# Patient Record
Sex: Male | Born: 1940 | Race: White | Hispanic: No | Marital: Married
Health system: Midwestern US, Community
[De-identification: ages and names within clinical notes are randomized; demographics above are authoritative.]

## PROBLEM LIST (undated history)

## (undated) DIAGNOSIS — I639 Cerebral infarction, unspecified: Secondary | ICD-10-CM

## (undated) DIAGNOSIS — E119 Type 2 diabetes mellitus without complications: Secondary | ICD-10-CM

## (undated) DIAGNOSIS — H269 Unspecified cataract: Secondary | ICD-10-CM

## (undated) DIAGNOSIS — I10 Essential (primary) hypertension: Secondary | ICD-10-CM

## (undated) DIAGNOSIS — H409 Unspecified glaucoma: Secondary | ICD-10-CM

## (undated) HISTORY — PX: IRIDOTOMY / IRIDECTOMY: SHX165

## (undated) HISTORY — DX: Cerebral infarction, unspecified: I63.9

## (undated) HISTORY — PX: TRABECULECTOMY: SHX107

## (undated) HISTORY — DX: Unspecified cataract: H26.9

## (undated) HISTORY — DX: Type 2 diabetes mellitus without complications: E11.9

## (undated) HISTORY — DX: Essential (primary) hypertension: I10

## (undated) HISTORY — PX: EYE SURGERY: SHX253

## (undated) HISTORY — DX: Unspecified glaucoma: H40.9

## (undated) HISTORY — PX: YAG LASER APPLICATION: SHX6189

## (undated) HISTORY — PX: CATARACT EXTRACTION: SUR2

---

## 2016-05-09 ENCOUNTER — Inpatient Hospital Stay: Admit: 2016-05-09 | Discharge: 2016-05-09 | Disposition: A | Discharge status: Home / Self Care | Payer: MEDICARE

## 2016-05-09 ENCOUNTER — Emergency Department: Admit: 2016-05-09 | Payer: MEDICARE | Primary: Family Medicine

## 2016-05-09 DIAGNOSIS — S96812A Strain of other specified muscles and tendons at ankle and foot level, left foot, initial encounter: Secondary | ICD-10-CM

## 2016-05-09 NOTE — ED Triage Notes (Signed)
Right heel pain since 10/25, pt believes it was from exercising using the rowing machine. Increased pain in heel with ankle flexion. Ambulated back without difficulty. No deformities, swelling, bruising noted. Strong pedal pulse, cap refill < 3 seconds

## 2016-05-09 NOTE — ED Provider Notes (Addendum)
Patient is a 75 y.o. male presenting with foot pain. The history is provided by the patient.   Foot Pain    This is a new problem. Episode onset: 1 week ago. The problem occurs constantly. The problem has been gradually improving. Pain location: left heel. The quality of the pain is described as dull. The pain is at a severity of 2/10. The pain is mild. Pertinent negatives include no numbness, full range of motion, no stiffness, no tingling, no itching, no back pain and no neck pain. The symptoms are aggravated by contact and activity. He has tried nothing for the symptoms. The treatment provided no relief. There has been no history of extremity trauma.    One week ago, after finishing about 30 minutes on a rowing machine, he noted pain in the plantar aspect of his left heel that is mild, dull, less painful in the morning, and worse as he walks. He has had no direct trauma. He hasn't seen any redness or swelling. He has not pain over his Achilles or ankle. He has not tried anything for his pain. His friends convinced him that he should get an xray.    Past Medical History:   Diagnosis Date   ??? Diabetes (HCC)    ??? Glaucoma    ??? High cholesterol    ??? Hypertension    ??? Macular degeneration        History reviewed. No pertinent surgical history.      History reviewed. No pertinent family history.    Social History     Social History   ??? Marital status: N/A     Spouse name: N/A   ??? Number of children: N/A   ??? Years of education: N/A     Occupational History   ??? Not on file.     Social History Main Topics   ??? Smoking status: Never Smoker   ??? Smokeless tobacco: Never Used   ??? Alcohol use Yes      Comment: socially   ??? Drug use: No   ??? Sexual activity: Not on file     Other Topics Concern   ??? Not on file     Social History Narrative   ??? No narrative on file         ALLERGIES: Review of patient's allergies indicates no known allergies.    Review of Systems   Constitutional: Negative for chills and fever.    HENT: Negative for sore throat.    Eyes: Negative for pain.   Respiratory: Negative for shortness of breath.    Cardiovascular: Negative for chest pain, palpitations and leg swelling.   Gastrointestinal: Negative for abdominal pain and vomiting.   Endocrine: Negative for polydipsia.   Genitourinary: Negative for hematuria.   Musculoskeletal: Negative for back pain, joint swelling, neck pain, neck stiffness and stiffness.   Skin: Negative for itching.   Allergic/Immunologic: Negative for immunocompromised state.   Neurological: Negative for tingling, weakness, light-headedness and numbness.   Hematological: Negative for adenopathy. Does not bruise/bleed easily.   Psychiatric/Behavioral: Negative for confusion.   All other systems reviewed and are negative.      Vitals:    05/09/16 1313   BP: 173/74   Pulse: (!) 56   Resp: 18   Temp: 98.3 ??F (36.8 ??C)   Weight: 104.3 kg (230 lb)   Height: 5\' 11"  (1.803 m)            Physical Exam   Constitutional: He is oriented  to person, place, and time. He appears well-developed and well-nourished. No distress.   HENT:   Head: Normocephalic and atraumatic.   Right Ear: External ear normal.   Left Ear: External ear normal.   Eyes: EOM are normal. Pupils are equal, round, and reactive to light.   Neck: Normal range of motion. Neck supple.   Cardiovascular: Normal rate.    Pulmonary/Chest: Effort normal.   Musculoskeletal:        Left ankle: He exhibits normal range of motion, no swelling, no ecchymosis, no deformity, no laceration and normal pulse. No tenderness. No lateral malleolus, no medial malleolus, no AITFL, no CF ligament, no posterior TFL, no head of 5th metatarsal and no proximal fibula tenderness found. Achilles tendon exhibits no pain, no defect and normal Thompson's test results.        Left foot: There is tenderness and bony tenderness. There is normal range of motion, no swelling, normal capillary refill, no crepitus, no deformity and no laceration.        Feet:     Neurological: He is alert and oriented to person, place, and time. He has normal reflexes. No cranial nerve deficit. Coordination normal.   Skin: Skin is warm and dry. No erythema.   Psychiatric: He has a normal mood and affect.   Nursing note and vitals reviewed.       MDM  Number of Diagnoses or Management Options  Diagnosis management comments: Left heel pain x 1 wk, improving. Possible plantar fasciitis (but feels best first thing in the morning) vs occult fracture vs sprain       Amount and/or Complexity of Data Reviewed  Tests in the radiology section of CPT??: ordered and reviewed  Review and summarize past medical records: yes  Independent visualization of images, tracings, or specimens: yes    Risk of Complications, Morbidity, and/or Mortality  Presenting problems: moderate  Diagnostic procedures: moderate  Management options: moderate      ED Course   Calcaneus xray - neg by my view (small plantar spur)    A): Strain, left heel    P): OTC NSAIDs for pain   Orthopedics f/u in one week if not improving    Procedures

## 2016-05-09 NOTE — ED Notes (Signed)
Portable x-ray in room 

## 2016-05-09 NOTE — ED Notes (Signed)
Discharge instructions reviewed with patient, verbalized understanding. All questions answered. Ambulated out without difficulty.

## 2016-09-30 DIAGNOSIS — N401 Enlarged prostate with lower urinary tract symptoms: Secondary | ICD-10-CM | POA: Insufficient documentation

## 2016-09-30 DIAGNOSIS — I10 Essential (primary) hypertension: Secondary | ICD-10-CM | POA: Insufficient documentation

## 2016-09-30 DIAGNOSIS — E7800 Pure hypercholesterolemia, unspecified: Secondary | ICD-10-CM | POA: Insufficient documentation

## 2017-02-16 NOTE — Progress Notes (Signed)
[facility] Note  02/17/2017     CHIEF COMPLAINT Patient presents for Diabetic Eye Exam   HISTORY OF PRESENT ILLNESS: Jeffery Beltran is a 76 y.o. male who presents to the clinic today for:   HPI    Diabetic Eye Exam  Vision is stable.  Associated Symptoms Trauma.  Negative for Flashes, Floaters, Distortion, Redness, Pain, Glare, Blind Spot, Photophobia, Scalp Tenderness, Fever, Weight Loss, Fatigue, Shoulder/Hip pain and Jaw Claudication (Pt states that he was hit in OD with baseball at age 89).  Diabetes characteristics include Type 2 and taking oral medications.  This started 25 years ago.  Blood sugar level is controlled.  Last Blood Glucose 112.  Last A1C 7.  I, the attending physician,  performed the HPI with the patient and updated documentation appropriately.        Comments  Pt reports that he has has trab OD, cat sx OD, and YAG Cap OD; Pt reports using alphagan OU BID, azopt OS BID, and xalatan OU qhs;      Last edited by Rennis ChrisZamora, Kristelle Cavallaro, MD on 02/17/2017 10:38 AM. (History)      Referring physician: A. Melissa Solum [address] [place], [place] [id number]    HISTORICAL INFORMATION:   Selected notes from the MEDICAL RECORD NUMBER Referral from Dr. 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 AZOPT 1 % ophthalmic suspension   . brimonidine (ALPHAGAN P) 0.1 % SOLN Apply to eye.  . latanoprost (XALATAN) 0.005 % ophthalmic solution    No current facility-administered medications for this  visit.  (Ophthalmic Drugs)   Current Outpatient Prescriptions (Other)  Medication Sig  . metFORMIN (GLUCOPHAGE-XR) 500 MG 24 hr tablet Take one tablet at supper for first week, then increase to two tablets at supper.  Marland Kitchen. HUMALOG KWIKPEN 100 UNIT/ML KiwkPen   . hydrochlorothiazide (HYDRODIURIL) 12.5 MG tablet Take by mouth.  . hydrochlorothiazide (MICROZIDE) 12.5 MG capsule   . Insulin Glargine 300 UNIT/ML SOPN Inject into the skin.  . Loratadine 10 MG CAPS Take by mouth.  . losartan (COZAAR) 100 MG tablet Take by mouth.  . nebivolol (BYSTOLIC) 5 MG tablet Take by mouth.  Marland Kitchen. NOVOFINE 32G X 6 MM MISC   . ONE TOUCH ULTRA TEST test strip   . simvastatin (ZOCOR) 20 MG tablet    No current facility-administered medications for this visit.  (Other)      REVIEW OF SYSTEMS: ROS    Negative for: Constitutional, Gastrointestinal, Neurological, Skin, Genitourinary, Musculoskeletal, HENT, Endocrine, Cardiovascular, Eyes, Respiratory, Psychiatric, Allergic/Imm, Heme/Lymph   Last edited by Murtis Sinkobinson, Meredith on 02/17/2017  9:34 AM. (History)       ALLERGIES No Known Allergies  PAST MEDICAL HISTORY Past Medical History:  Diagnosis Date  . Diabetes (HCC)   . Glaucoma   . Hypertension    Past Surgical History:  Procedure Laterality Date  . CATARACT EXTRACTION Right   . TRABECULECTOMY Right   . YAG LASER APPLICATION  Right     FAMILY HISTORY Family History  Problem Relation Age of Onset  . Amblyopia Neg Hx   . Blindness Neg Hx   . Cataracts Neg Hx   . Glaucoma Neg Hx   . Macular degeneration Neg Hx   . Retinal detachment Neg Hx   . Strabismus Neg Hx   . Retinitis pigmentosa Neg Hx     SOCIAL HISTORY Social History  Substance Use Topics  . Smoking status: Never Smoker  . Smokeless tobacco: Never Used  . Alcohol use Not on file         OPHTHALMIC EXAM:  Base Eye Exam    Visual Acuity (Snellen - Linear)      Right Left   Dist cc 20/200 20/25   Dist ph cc NI 20/20    Correction:  Glasses       Tonometry (Applanation, 9:31 AM)      Right Left   Pressure 18 16       Pupils      Dark Light Shape React APD   Right 4 3 Round Brisk None   Left 4 3 Round Brisk None       Visual Fields (Counting fingers)      Left Right    Full Full       Extraocular Movement      Right Left    Full, Ortho Full, Ortho       Dilation    Both eyes:  1.0% Mydriacyl, 2.5% Phenylephrine @ 9:31 AM   1% myd and 2.5% phen X 2 @ 9:55 AM        Slit Lamp and Fundus Exam    External Exam      Right Left   External Normal Normal       Slit Lamp Exam      Right Left   Lids/Lashes Dermatochalasis - upper lid Dermatochalasis - upper lid   Conjunctiva/Sclera White and quiet White and quiet   Cornea tempral incision well healed Clear; mild vertical endostria temporal   Anterior Chamber Deep and quiet Deep and quiet   Iris Round and reactive; dilated moderate dilation 4.5 mm    Lens Posterior chamber intraocular lens; open PC 1-2+ Nuclear sclerosis   Vitreous Vitreous syneresis - mild Normal       Fundus Exam      Right Left   Disc Normal Full ?drusen superior   C/D Ratio 0.2 0.1   Macula Atrophy central with RPE clumping , no edema; single MA ST to fovea Flat, normal   Vessels attenuated Normal, mild tortuosity   Periphery Flat Flat        Refraction    Wearing Rx      Sphere Cylinder Axis Add   Right -0.75 +0.75 010 +3.00   Left -0.25 +1.00 169 +3.00   Age:  3yrs   Type:  Bifocal       Manifest Refraction      Sphere Cylinder Axis Dist VA   Right -0.75 +0.75 010 20/200 (NI)   Left -0.75 +1.00 170 20/20          IMAGING AND PROCEDURES  Imaging and Procedures for 02/17/17  OCT, Retina - OU - Both Eyes     Right Eye Quality was good. Central Foveal Thickness: 321. Progression has no prior data. Findings include abnormal foveal contour, epiretinal membrane (Central outer retinal atrophy; no IRF/SRF).   Left Eye Quality was good. Central  Foveal Thickness: 333.  Progression has no prior data. Findings include normal foveal contour (No IRF/SRF).   Notes Images taken, stored on drive                ASSESSMENT/PLAN:    ICD-10-CM   1. 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 History of trabeculectomy Z98.890   4. 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 Type 2 diabetes mellitus without complication, unspecified whether long term insulin use (HCC) E11.9   7. Epiretinal membrane (ERM) of right eye H35.371   8. 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  History of CRVO w/ CME OD  - baseline VA 20/200 -- stable  - history of intravitreal steroids and steroid response - see below  - OCT today shows central foveal outer retinal atrophy but no IRF/SRF/edema  - monitor  2.  Pseudophakia OD  - s/p yag capsulotomy  - PCIOL in good position and clear  3,4.  Steroid response / Ocular Hypertension s/p trabeculectomy OD  - IOP okay today  - monitor   5.  NSC/CC OS  - The symptoms of cataract, surgical options, and treatments and risks were discussed with patient.  - discussed diagnosis and progression  - not yet visually significant  - monitor for now  6.  DM2 without retinopathy  - Discussed importance of tight BG and BP control  - will send letter to PCP and Endocrinologist  7. ERM OD  - mild ERM in right eye that is limited by central atrophy 2/2 h/o CRVO  - monitor -- no indication for surgery at this time  - will discuss if symptoms develop or vision worsens OD  8. Optic disc drusen OS  - discussed diagnosis and significance to vision  - not visually significant at this point  - will monitor for now -- consider b-scan ultrasound and/or OCT optic disc at next visit    Ophthalmic Meds Ordered this visit:  No orders of the defined types were  placed in this encounter.      Return in about 6 months (around 08/20/2017) for Dilated Exam, OCT; possible OCT disc and/or U/S.  There are no Patient Instructions on file for this visit.   Explained the diagnoses, plan, and follow up with the patient and they expressed understanding.  Patient expressed understanding of the importance of proper follow up care.   Karie Chimera, M.D., Ph.D. Vitreoretinal Surgeon [facility] 02/17/17     Abbreviations: M myopia (nearsighted); A astigmatism; H hyperopia (farsighted); P presbyopia; Mrx spectacle prescription;  CTL contact lenses; OD right eye; OS left eye; OU both eyes  XT exotropia; ET esotropia; PEK punctate epithelial keratitis; PEE punctate epithelial erosions; DES dry eye syndrome; MGD meibomian gland dysfunction; ATs artificial tears; PFAT's preservative free artificial tears; NSC nuclear sclerotic cataract; PSC posterior subcapsular cataract; ERM epi-retinal membrane; PVD posterior vitreous detachment; RD retinal detachment; DM diabetes mellitus; DR diabetic retinopathy; NPDR non-proliferative diabetic retinopathy; PDR proliferative diabetic retinopathy; CSME clinically significant macular edema; DME diabetic macular edema; dbh dot blot hemorrhages; CWS cotton wool spot; POAG primary open angle glaucoma; C/D cup-to-disc ratio; HVF humphrey visual field; GVF goldmann visual field; OCT optical coherence tomography; IOP intraocular pressure; BRVO Branch retinal vein occlusion; CRVO central retinal vein occlusion; CRAO central retinal artery  occlusion; BRAO branch retinal artery occlusion; RT retinal tear; SB scleral buckle; PPV pars plana vitrectomy; VH Vitreous hemorrhage; PRP panretinal laser photocoagulation; IVK intravitreal kenalog; VMT vitreomacular traction; MH Macular hole;  NVD neovascularization of the disc; NVE neovascularization elsewhere; AREDS age related eye disease study; ARMD age related macular  degeneration; POAG primary open angle glaucoma; EBMD epithelial/anterior basement membrane dystrophy; ACIOL anterior chamber intraocular lens; IOL intraocular lens; PCIOL posterior chamber intraocular lens; Phaco/IOL phacoemulsification with intraocular lens placement; PRK photorefractive keratectomy; LASIK laser assisted in situ keratomileusis; HTN hypertension; DM diabetes mellitus; COPD chronic obstructive pulmonary disease

## 2017-02-17 ENCOUNTER — Ambulatory Visit (INDEPENDENT_AMBULATORY_CARE_PROVIDER_SITE_OTHER): Payer: Medicare Other | Admitting: Ophthalmology

## 2017-02-17 ENCOUNTER — Encounter (INDEPENDENT_AMBULATORY_CARE_PROVIDER_SITE_OTHER): Payer: Self-pay | Admitting: Ophthalmology

## 2017-02-17 DIAGNOSIS — E119 Type 2 diabetes mellitus without complications: Secondary | ICD-10-CM | POA: Diagnosis not present

## 2017-02-17 DIAGNOSIS — H34811 Central retinal vein occlusion, right eye, with macular edema: Secondary | ICD-10-CM

## 2017-02-17 DIAGNOSIS — H2512 Age-related nuclear cataract, left eye: Secondary | ICD-10-CM

## 2017-02-17 DIAGNOSIS — H40059 Ocular hypertension, unspecified eye: Secondary | ICD-10-CM | POA: Diagnosis not present

## 2017-02-17 DIAGNOSIS — Z9889 Other specified postprocedural states: Secondary | ICD-10-CM

## 2017-02-17 DIAGNOSIS — Z961 Presence of intraocular lens: Secondary | ICD-10-CM

## 2017-02-17 DIAGNOSIS — H35371 Puckering of macula, right eye: Secondary | ICD-10-CM

## 2017-02-17 DIAGNOSIS — H47322 Drusen of optic disc, left eye: Secondary | ICD-10-CM

## 2017-08-21 NOTE — Progress Notes (Signed)
[facility] Note  08/22/2017     CHIEF COMPLAINT Patient presents for Retina Follow Up   HISTORY OF PRESENT ILLNESS: Jeffery Beltran is a 77 y.o. male who presents to the clinic today for:   HPI    Retina Follow Up    Patient presents with  CRVO/BRVO.  In right eye.  This started 6 months ago.  Severity is mild.  Since onset it is stable.  I, the attending physician,  performed the HPI with the patient and updated documentation appropriately.          Comments    F/U Crvo w/ Atrophy OD. Patient states his vision is stable, no new onset. Pt reports Dr. Joelene Millin told him he no longer had Diabetic Retinopathy. A1c 6.3 (10/18)  Pt uses Alphagan, Xalatan ,Azopt as directed        Last edited by Rennis Chris, MD on 08/22/2017  8:30 AM. (History)    Pt states there has been no change since being seen last;   Referring physician: A. Melissa Solum [address] [place], [place] [address]    HISTORICAL INFORMATION:   Selected notes from the MEDICAL RECORD NUMBER Referral from Dr. 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 AZOPT 1 % ophthalmic suspension   . brimonidine (ALPHAGAN P) 0.1 % SOLN Apply to eye.  . latanoprost (XALATAN) 0.005 % ophthalmic solution    No current facility-administered medications for this visit.  (Ophthalmic Drugs)   Current Outpatient Medications (Other)  Medication Sig  . HUMALOG KWIKPEN 100 UNIT/ML KiwkPen   . hydrochlorothiazide (HYDRODIURIL) 12.5 MG tablet Take by  mouth.  . hydrochlorothiazide (MICROZIDE) 12.5 MG capsule   . Insulin Glargine 300 UNIT/ML SOPN Inject into the skin.  . Loratadine 10 MG CAPS Take by mouth.  . losartan (COZAAR) 100 MG tablet Take by mouth.  . metFORMIN (GLUCOPHAGE-XR) 500 MG 24 hr tablet Take one tablet at supper for first week, then increase to two tablets at supper.  . nebivolol (BYSTOLIC) 5 MG tablet Take by mouth.  Marland Kitchen NOVOFINE 32G X 6 MM MISC   . ONE TOUCH ULTRA TEST test strip   . simvastatin (ZOCOR) 20 MG tablet    No current facility-administered medications for this visit.  (Other)      REVIEW OF SYSTEMS: ROS    Positive for: Endocrine, Eyes   Negative for: Constitutional, Gastrointestinal, Neurological, Skin, Genitourinary, Musculoskeletal, HENT, Cardiovascular, Respiratory, Psychiatric, Allergic/Imm, Heme/Lymph   Last edited by Eldridge Scot, LPN on 12/10/5407  8:14 AM. (History)       ALLERGIES No Known Allergies  PAST MEDICAL HISTORY Past Medical History:  Diagnosis Date  . Diabetes (HCC)   . Glaucoma   . Hypertension    Past Surgical History:  Procedure Laterality Date  . CATARACT EXTRACTION Right   . TRABECULECTOMY Right   . YAG LASER APPLICATION Right     FAMILY HISTORY Family History  Problem Relation Age of Onset  .  Amblyopia Neg Hx   . Blindness Neg Hx   . Cataracts Neg Hx   . Glaucoma Neg Hx   . Macular degeneration Neg Hx   . Retinal detachment Neg Hx   . Strabismus Neg Hx   . Retinitis pigmentosa Neg Hx     SOCIAL HISTORY Social History   Tobacco Use  . Smoking status: Never Smoker  . Smokeless tobacco: Never Used  Substance Use Topics  . Alcohol use: Not on file  . Drug use: Not on file         OPHTHALMIC EXAM:  Base Eye Exam    Visual Acuity (Snellen - Linear)      Right Left   Dist cc 20/250-1 20/25 +2   Dist ph cc NI 20/20   Correction:  Glasses       Tonometry (Tonopen, 8:18 AM)      Right Left   Pressure 25 17       Tonometry #2  (Tonopen, 8:18 AM)      Right Left   Pressure 22        Pupils      Dark Light Shape React APD   Right 3 2 Round Brisk None   Left 3 2 Round Brisk None       Visual Fields (Counting fingers)      Left Right    Full Full       Extraocular Movement      Right Left    Full, Ortho Full, Ortho       Neuro/Psych    Oriented x3:  Yes   Mood/Affect:  Normal       Dilation    Both eyes:  1.0% Mydriacyl, 2.5% Phenylephrine @ 8:18 AM        Slit Lamp and Fundus Exam    External Exam      Right Left   External Normal Normal       Slit Lamp Exam      Right Left   Lids/Lashes Dermatochalasis - upper lid Dermatochalasis - upper lid   Conjunctiva/Sclera White and quiet White and quiet   Cornea 1+ Punctate epithelial erosions, Well healed cataract wounds Clear; mild vertical endostria temporal   Anterior Chamber Deep and quiet Deep and quiet   Iris Round and dilated, No NVI Round and dilated   Lens Posterior chamber intraocular lens; open PC 1-2+ Nuclear sclerosis   Vitreous Vitreous syneresis - mild Posterior vitreous detachment, Weiss ring       Fundus Exam      Right Left   Disc Normal Full ?drusen superior   C/D Ratio 0.2 0.1   Macula RPE central atrophy and clumping, No heme or edema Flat, normal, mild Retinal pigment epithelial mottling   Vessels Vascular attenuation, Tortuous Normal, mild tortuosity   Periphery Flat Flat          IMAGING AND PROCEDURES  Imaging and Procedures for 08/22/17  OCT, Retina - OU - Both Eyes     Right Eye Quality was good. Central Foveal Thickness: 320. Progression has been stable. Findings include abnormal foveal contour, epiretinal membrane, subretinal hyper-reflective material, outer retinal atrophy, no IRF, no SRF.   Left Eye Quality was good. Central Foveal Thickness: 328. Progression has been stable. Findings include normal foveal contour, no IRF, no SRF.   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 CME - cystoid macular edema. PED - pigment epithelial detachment. IRF - intraretinal fluid. SRF - subretinal fluid. EZ - ellipsoid zone. ERM - epiretinal membrane. ORA - outer retinal atrophy. ORT - outer retinal tubulation. SRHM - subretinal hyper-reflective material                  ASSESSMENT/PLAN:    ICD-10-CM   1. 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 History of trabeculectomy Z98.890   4. 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 Type 2 diabetes mellitus without complication, unspecified whether long term insulin use (HCC) E11.9   7. Epiretinal membrane (ERM) of right eye H35.371   8. 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  History of CRVO w/ CME OD  - baseline VA 20/200 -- stable  - history of intravitreal steroids and steroid response - see below  - OCT stable today, still with central foveal outer retinal atrophy but no IRF/SRF/edema  - monitor  - F/U 6 months  2.  Pseudophakia OD  - s/p CE/IOL  - s/p yag capsulotomy  - beautiful surgery, doing well  - monitor  3,4.  Steroid response / Ocular Hypertension s/p trabeculectomy OD  - IOP borderline OD today  - using Alphagan OU BID, Xalatan OU qd, Azopt OS BID as perscribed by Dr. Joelene Millin  - recommend Azopt OD BID  - followed by Dr. Vena Austria at [facility] at [address], [address], [place] [address], telephone :[phone] fax:[phone]  - will send letter to Dr. Joelene Millin   - monitor   5.  NSC/CC OS  - The symptoms of cataract, surgical options, and treatments and risks were discussed with patient.  - discussed diagnosis and  progression  - not yet visually significant  - monitor for now  6.  DM2 without retinopathy  - Discussed importance of tight BG and BP control  - letters sent to PCP and Endocrinologist  7. ERM OD  - mild ERM in right eye that is limited by central atrophy 2/2 h/o CRVO  - monitor -- no indication for surgery at this time  - will discuss if symptoms develop or vision worsens OD  8. Optic disc drusen OS  - discussed diagnosis and significance to vision  - not visually significant at this point  - will monitor for now    Ophthalmic Meds Ordered this visit:  No orders of the defined types were placed in this encounter.      Return in about 6 months (around 02/19/2018) for Dilated Exam, OCT.  There are no Patient Instructions on file for this visit.   Explained the diagnoses, plan, and follow up with the patient and they expressed understanding.  Patient expressed understanding of the importance of proper follow up care.   This document serves as a record of services personally performed by Karie Chimera, MD, PhD. It was created on their behalf by Virgilio Belling, COA, a certified ophthalmic assistant. The creation of this record is the provider's dictation and/or activities during the visit.  Electronically signed by: Virgilio Belling, COA  08/22/17 9:33 AM    Karie Chimera, M.D., Ph.D. Vitreoretinal Surgeon [facility] 08/22/17  I have reviewed the above documentation for accuracy and completeness, and I agree with the above. Karie ChimeraBrian G. Yalonda Sample, M.D., Ph.D. 08/22/17 9:34 AM    Abbreviations: M myopia (nearsighted); A astigmatism; H hyperopia (farsighted); P presbyopia; Mrx spectacle prescription;  CTL contact lenses; OD right eye; OS left eye; OU both eyes  XT exotropia; ET esotropia; PEK punctate epithelial keratitis; PEE punctate epithelial erosions; DES dry eye syndrome; MGD meibomian gland dysfunction; ATs artificial tears; PFAT's preservative  free artificial tears; NSC nuclear sclerotic cataract; PSC posterior subcapsular cataract; ERM epi-retinal membrane; PVD posterior vitreous detachment; RD retinal detachment; DM diabetes mellitus; DR diabetic retinopathy; NPDR non-proliferative diabetic retinopathy; PDR proliferative diabetic retinopathy; CSME clinically significant macular edema; DME diabetic macular edema; dbh dot blot hemorrhages; CWS cotton wool spot; POAG primary open angle glaucoma; C/D cup-to-disc ratio; HVF humphrey visual field; GVF goldmann visual field; OCT optical coherence tomography; IOP intraocular pressure; BRVO Branch retinal vein occlusion; CRVO central retinal vein occlusion; CRAO central retinal artery occlusion; BRAO branch retinal artery occlusion; RT retinal tear; SB scleral buckle; PPV pars plana vitrectomy; VH Vitreous hemorrhage; PRP panretinal laser photocoagulation; IVK intravitreal kenalog; VMT vitreomacular traction; MH Macular hole;  NVD neovascularization of the disc; NVE neovascularization elsewhere; AREDS age related eye disease study; ARMD age related macular degeneration; POAG primary open angle glaucoma; EBMD epithelial/anterior basement membrane dystrophy; ACIOL anterior chamber intraocular lens; IOL intraocular lens; PCIOL posterior chamber intraocular lens; Phaco/IOL phacoemulsification with intraocular lens placement; PRK photorefractive keratectomy; LASIK laser assisted in situ keratomileusis; HTN hypertension; DM diabetes mellitus; COPD chronic obstructive pulmonary disease

## 2017-08-22 ENCOUNTER — Encounter (INDEPENDENT_AMBULATORY_CARE_PROVIDER_SITE_OTHER): Payer: Medicare Other | Admitting: Ophthalmology

## 2017-08-22 ENCOUNTER — Encounter (INDEPENDENT_AMBULATORY_CARE_PROVIDER_SITE_OTHER): Payer: Self-pay | Admitting: Ophthalmology

## 2017-08-22 ENCOUNTER — Ambulatory Visit (INDEPENDENT_AMBULATORY_CARE_PROVIDER_SITE_OTHER): Payer: Medicare Other | Admitting: Ophthalmology

## 2017-08-22 DIAGNOSIS — H47322 Drusen of optic disc, left eye: Secondary | ICD-10-CM | POA: Diagnosis not present

## 2017-08-22 DIAGNOSIS — H40059 Ocular hypertension, unspecified eye: Secondary | ICD-10-CM

## 2017-08-22 DIAGNOSIS — Z961 Presence of intraocular lens: Secondary | ICD-10-CM | POA: Diagnosis not present

## 2017-08-22 DIAGNOSIS — Z9889 Other specified postprocedural states: Secondary | ICD-10-CM

## 2017-08-22 DIAGNOSIS — H2512 Age-related nuclear cataract, left eye: Secondary | ICD-10-CM | POA: Diagnosis not present

## 2017-08-22 DIAGNOSIS — H35371 Puckering of macula, right eye: Secondary | ICD-10-CM | POA: Diagnosis not present

## 2017-08-22 DIAGNOSIS — H34811 Central retinal vein occlusion, right eye, with macular edema: Secondary | ICD-10-CM | POA: Diagnosis not present

## 2017-08-22 DIAGNOSIS — E119 Type 2 diabetes mellitus without complications: Secondary | ICD-10-CM

## 2017-10-30 ENCOUNTER — Other Ambulatory Visit: Payer: Self-pay | Admitting: Family Medicine

## 2017-10-30 ENCOUNTER — Ambulatory Visit
Admission: RE | Admit: 2017-10-30 | Discharge: 2017-10-30 | Disposition: A | Discharge status: Home / Self Care | Payer: Medicare Other | Source: Ambulatory Visit | Attending: Family Medicine | Admitting: Family Medicine

## 2017-10-30 DIAGNOSIS — M7989 Other specified soft tissue disorders: Secondary | ICD-10-CM | POA: Diagnosis not present

## 2017-10-30 DIAGNOSIS — M7121 Synovial cyst of popliteal space [Baker], right knee: Secondary | ICD-10-CM | POA: Diagnosis not present

## 2018-02-18 NOTE — Progress Notes (Signed)
[facility] Note  02/19/2018     CHIEF COMPLAINT Patient presents for Retina Follow Up   HISTORY OF PRESENT ILLNESS: Jeffery BatheMartin Beltran is a 77 y.o. male who presents to the clinic today for:   HPI    Retina Follow Up    Patient presents with  Other.  In right eye.  This started 6 months ago.  Severity is mild.  Since onset it is stable.          Comments    F/UCRVO/BRVO OD. Patient states his vision is about the same since last ov, Denies new visual onsets. BS 170 this am , pt states bs have been WINL.  Pt is using Alphagan Bid os, Xalatan 2Hs OU, Azopt qd OS.        Last edited by Eldridge ScotKendrick, Glenda, LPN on 1/61/09608/15/2019  8:42 AM. (History)    Pt states he has been back to see Dr. Joelene Millinliver since being seen last; Pt states IOP has been stable;   Referring physician: A. Melissa Solum [address] [place], [place] [id number]    HISTORICAL INFORMATION:   Selected notes from the MEDICAL RECORD NUMBER Referral from Dr. 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 AZOPT 1 % ophthalmic suspension   . brimonidine (ALPHAGAN P) 0.1 % SOLN Apply to eye.  . latanoprost (XALATAN) 0.005 % ophthalmic solution    No current facility-administered medications for this visit.  (Ophthalmic Drugs)   Current Outpatient Medications (Other)  Medication Sig  . HUMALOG KWIKPEN 100 UNIT/ML KiwkPen   . hydrochlorothiazide (HYDRODIURIL) 12.5 MG tablet Take by mouth.  . hydrochlorothiazide (MICROZIDE) 12.5 MG capsule   .  Insulin Glargine 300 UNIT/ML SOPN Inject into the skin.  . Loratadine 10 MG CAPS Take by mouth.  . losartan (COZAAR) 100 MG tablet Take by mouth.  . metFORMIN (GLUCOPHAGE-XR) 500 MG 24 hr tablet Take one tablet at supper for first week, then increase to two tablets at supper.  . nebivolol (BYSTOLIC) 5 MG tablet Take by mouth.  Marland Kitchen. NOVOFINE 32G X 6 MM MISC   . ONE TOUCH ULTRA TEST test strip   . simvastatin (ZOCOR) 20 MG tablet    No current facility-administered medications for this visit.  (Other)      REVIEW OF SYSTEMS: ROS    Positive for: Endocrine, Eyes   Negative for: Constitutional, Gastrointestinal, Neurological, Skin, Genitourinary, Musculoskeletal, HENT, Cardiovascular, Respiratory, Psychiatric, Allergic/Imm, Heme/Lymph   Last edited by Eldridge ScotKendrick, Glenda, LPN on 8/11/91478/15/2019  8:42 AM. (History)       ALLERGIES No Known Allergies  PAST MEDICAL HISTORY Past Medical History:  Diagnosis Date  . Diabetes (HCC)   . Glaucoma   . Hypertension    Past Surgical History:  Procedure Laterality Date  . CATARACT EXTRACTION Right   . TRABECULECTOMY Right   . YAG LASER APPLICATION Right     FAMILY HISTORY Family History  Problem Relation Age of Onset  . Amblyopia  Neg Hx   . Blindness Neg Hx   . Cataracts Neg Hx   . Glaucoma Neg Hx   . Macular degeneration Neg Hx   . Retinal detachment Neg Hx   . Strabismus Neg Hx   . Retinitis pigmentosa Neg Hx     SOCIAL HISTORY Social History   Tobacco Use  . Smoking status: Never Smoker  . Smokeless tobacco: Never Used  Substance Use Topics  . Alcohol use: Not on file  . Drug use: Not on file         OPHTHALMIC EXAM:  Base Eye Exam    Visual Acuity (Snellen - Linear)      Right Left   Dist cc 20/250 20/30 +1   Dist ph cc NI 20/25   Correction:  Glasses       Tonometry (Tonopen, 8:38 AM)      Right Left   Pressure 15 18       Pupils      Dark Light Shape React APD   Right 4 3 Round Brisk None   Left 4 3 Round  Brisk None       Visual Fields (Counting fingers)      Left Right    Full Full       Extraocular Movement      Right Left    Full, Ortho Full, Ortho       Neuro/Psych    Oriented x3:  Yes   Mood/Affect:  Normal       Dilation    Both eyes:  1.0% Mydriacyl, 2.5% Phenylephrine @ 8:38 AM        Slit Lamp and Fundus Exam    External Exam      Right Left   External Normal Normal       Slit Lamp Exam      Right Left   Lids/Lashes Dermatochalasis - upper lid Dermatochalasis - upper lid, Telangiectasia   Conjunctiva/Sclera Bleb at 1200 White and quiet   Cornea 1+ Punctate epithelial erosions, Well healed cataract wounds Arcus, 1+ diffuse Punctate epithelial erosions   Anterior Chamber Deep and quiet Deep and quiet   Iris Round and dilated, No NVI Round and moderately dilated   Lens Posterior chamber intraocular lens; open PC, mild anterior capsular fimosis 1-2+ Nuclear sclerosis, 2+ Cortical cataract   Vitreous Vitreous syneresis - mild Posterior vitreous detachment, Weiss ring       Fundus Exam      Right Left   Disc Normal Full ?drusen superior, mild elevation, Compact   C/D Ratio 0.2 0.1   Macula RPE central atrophy and clumping, No heme or edema Flat, god foveal reflex, mild Retinal pigment epithelial mottling   Vessels Vascular attenuation, Tortuous mild tortuosity   Periphery Attached Attached          IMAGING AND PROCEDURES  Imaging and Procedures for 08/22/17  OCT, Retina - OU - Both Eyes       Right Eye Quality was good. Central Foveal Thickness: 236. Progression has been stable. Findings include abnormal foveal contour, epiretinal membrane, subretinal hyper-reflective material, outer retinal atrophy, no IRF, no SRF, macular pucker.   Left Eye Quality was good. Central Foveal Thickness: 323. Progression has been stable. Findings include normal foveal contour, no IRF, no SRF.   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 CME - cystoid macular edema. PED - pigment epithelial detachment. IRF - intraretinal fluid. SRF - subretinal fluid. EZ - ellipsoid zone. ERM - epiretinal membrane. ORA - outer retinal atrophy. ORT - outer retinal tubulation. SRHM - subretinal hyper-reflective material                  ASSESSMENT/PLAN:    ICD-10-CM   1. 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 History of trabeculectomy Z98.890   4. 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 Type 2 diabetes mellitus without complication, unspecified whether long term insulin use (HCC) E11.9   7. Epiretinal membrane (ERM) of right eye H35.371   8. 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  History of CRVO w/ CME OD  - baseline VA 20/250 -- stable  - history of intravitreal steroids and steroid response - see below  - OCT stable today, still with central foveal outer retinal atrophy but no IRF/SRF/edema  - monitor  - F/U 9-12 months  2.  Pseudophakia OD  - s/p CE/IOL  - s/p yag capsulotomy  - beautiful surgeries, doing well  - monitor  3,4.  Steroid response / Ocular Hypertension s/p trabeculectomy OD  - IOP good OD today  - using Alphagan OU BID, Xalatan OU qd, Azopt OS BID as perscribed by Dr. Joelene Millin  - followed by Dr. Vena Austria at [facility] at [address] [place] [address], telephone :[phone] fax:[phone]  - monitor   5.  NSC/CC OS  - The symptoms of cataract, surgical options, and treatments and risks were discussed with patient.  - discussed diagnosis and progression  - not yet visually significant  - monitor for now  6.  Diabetes mellitus, type 2 without retinopathy - The incidence, risk factors for progression, natural history and treatment options for diabetic retinopathy  were discussed with patient.   - The need for close monitoring of blood glucose, blood pressure, and serum lipids, avoiding cigarette or any type of tobacco, and the need for long term follow up was also discussed with patient. - f/u in 1 year, sooner prn  7. ERM OD  - mild ERM/pucker in right eye that is limited by central atrophy 2/2 h/o CRVO  - monitor -- no indication for surgery at this time  - will discuss if symptoms develop or vision worsens OD  8. Optic disc drusen OS  - discussed diagnosis and significance to vision  - not visually significant at this point  - will monitor for now    Ophthalmic Meds Ordered this visit:  No orders of the defined types were placed in this encounter.      Return in about 1 year (around 02/20/2019) for F/U Hx of CRVO OD, DFE, OCT.  There are no Patient Instructions on file for this visit.   Explained the diagnoses, plan, and follow up with the patient and they expressed understanding.  Patient expressed understanding of the importance of proper follow up care.   This document serves as a record of services personally performed by Karie Chimera, MD, PhD. It was created on their behalf by Virgilio Belling, COA, a certified ophthalmic assistant. The creation of this record is the provider's dictation and/or activities during  the visit.  Electronically signed by: Virgilio BellingMeredith Fabian, COA  08.14.19 9:29 AM    Karie ChimeraBrian G. Nicholis Stepanek, M.D., Ph.D. Vitreoretinal Surgeon [facility]   I have reviewed the above documentation for accuracy and completeness, and I agree with the above. Karie ChimeraBrian G. Tishawna Larouche, M.D., Ph.D. 02/19/18 9:29 AM    Abbreviations: M myopia (nearsighted); A astigmatism; H hyperopia (farsighted); P presbyopia; Mrx spectacle prescription;  CTL contact lenses; OD  right eye; OS left eye; OU both eyes  XT exotropia; ET esotropia; PEK punctate epithelial keratitis; PEE punctate epithelial erosions; DES dry eye syndrome; MGD meibomian gland dysfunction; ATs artificial tears; PFAT's preservative free artificial tears; NSC nuclear sclerotic cataract; PSC posterior subcapsular cataract; ERM epi-retinal membrane; PVD posterior vitreous detachment; RD retinal detachment; DM diabetes mellitus; DR diabetic retinopathy; NPDR non-proliferative diabetic retinopathy; PDR proliferative diabetic retinopathy; CSME clinically significant macular edema; DME diabetic macular edema; dbh dot blot hemorrhages; CWS cotton wool spot; POAG primary open angle glaucoma; C/D cup-to-disc ratio; HVF humphrey visual field; GVF goldmann visual field; OCT optical coherence tomography; IOP intraocular pressure; BRVO Branch retinal vein occlusion; CRVO central retinal vein occlusion; CRAO central retinal artery occlusion; BRAO branch retinal artery occlusion; RT retinal tear; SB scleral buckle; PPV pars plana vitrectomy; VH Vitreous hemorrhage; PRP panretinal laser photocoagulation; IVK intravitreal kenalog; VMT vitreomacular traction; MH Macular hole;  NVD neovascularization of the disc; NVE neovascularization elsewhere; AREDS age related eye disease study; ARMD age related macular degeneration; POAG primary open angle glaucoma; EBMD epithelial/anterior basement membrane dystrophy; ACIOL anterior chamber intraocular lens; IOL intraocular lens; PCIOL posterior chamber intraocular lens; Phaco/IOL phacoemulsification with intraocular lens placement; PRK photorefractive keratectomy; LASIK laser assisted in situ keratomileusis; HTN hypertension; DM diabetes mellitus; COPD chronic obstructive pulmonary disease

## 2018-02-19 ENCOUNTER — Ambulatory Visit (INDEPENDENT_AMBULATORY_CARE_PROVIDER_SITE_OTHER): Payer: Medicare Other | Admitting: Ophthalmology

## 2018-02-19 ENCOUNTER — Encounter (INDEPENDENT_AMBULATORY_CARE_PROVIDER_SITE_OTHER): Payer: Self-pay | Admitting: Ophthalmology

## 2018-02-19 DIAGNOSIS — H34811 Central retinal vein occlusion, right eye, with macular edema: Secondary | ICD-10-CM | POA: Diagnosis not present

## 2018-02-19 DIAGNOSIS — E119 Type 2 diabetes mellitus without complications: Secondary | ICD-10-CM

## 2018-02-19 DIAGNOSIS — Z9889 Other specified postprocedural states: Secondary | ICD-10-CM | POA: Diagnosis not present

## 2018-02-19 DIAGNOSIS — H35371 Puckering of macula, right eye: Secondary | ICD-10-CM

## 2018-02-19 DIAGNOSIS — H47322 Drusen of optic disc, left eye: Secondary | ICD-10-CM

## 2018-02-19 DIAGNOSIS — Z961 Presence of intraocular lens: Secondary | ICD-10-CM | POA: Diagnosis not present

## 2018-02-19 DIAGNOSIS — H40059 Ocular hypertension, unspecified eye: Secondary | ICD-10-CM

## 2018-02-19 DIAGNOSIS — H2512 Age-related nuclear cataract, left eye: Secondary | ICD-10-CM

## 2018-03-16 ENCOUNTER — Ambulatory Visit
Admission: RE | Admit: 2018-03-16 | Discharge: 2018-03-16 | Disposition: A | Discharge status: Home / Self Care | Payer: Medicare Other | Source: Ambulatory Visit | Attending: Student | Admitting: Student

## 2018-03-16 ENCOUNTER — Other Ambulatory Visit: Payer: Self-pay | Admitting: Student

## 2018-03-16 DIAGNOSIS — M7121 Synovial cyst of popliteal space [Baker], right knee: Secondary | ICD-10-CM | POA: Diagnosis not present

## 2018-03-16 DIAGNOSIS — M79604 Pain in right leg: Secondary | ICD-10-CM | POA: Diagnosis present

## 2018-04-21 IMAGING — US US EXTREM LOW VENOUS*R*
1 series · 14 of 24 positions shown · non-contrast
Comparison: None

CLINICAL DATA: Swelling

EXAM:
RIGHT LOWER EXTREMITY VENOUS DOPPLER ULTRASOUND
TECHNIQUE: Gray-scale sonography with compression, as well as color and duplex
ultrasound, were performed to evaluate the deep venous system from
the level of the common femoral vein through the popliteal and
proximal calf veins.

[Series 1: us extrem low venous*right* · 0.08mm/px · 14 of 45 slices shown]
[im 1/45]
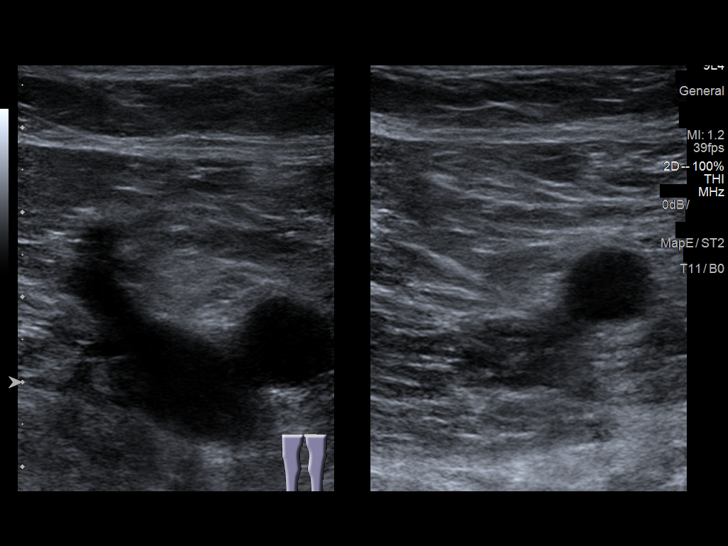
[im 4/45]
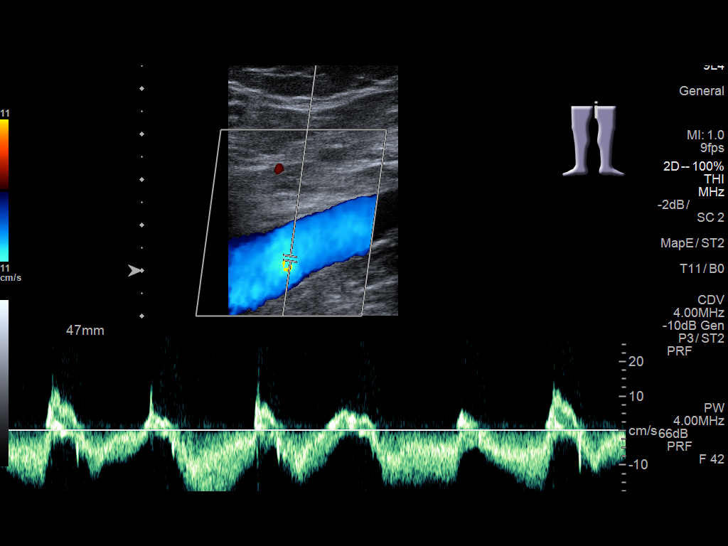
[im 8/45]
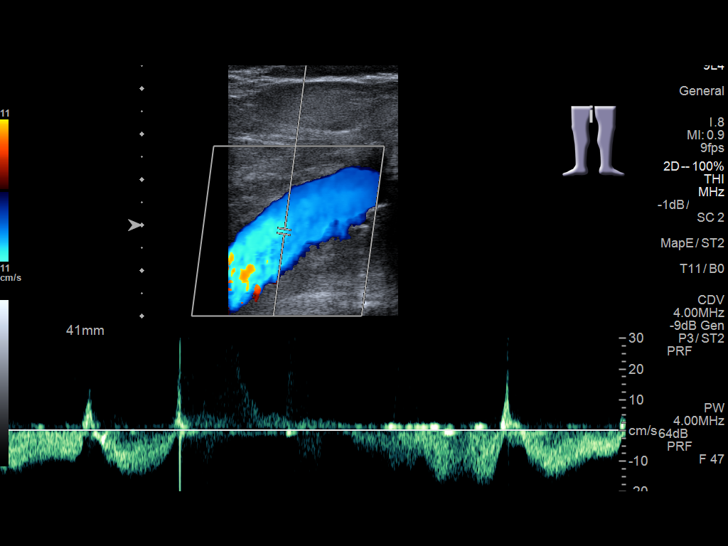
[im 12/45]
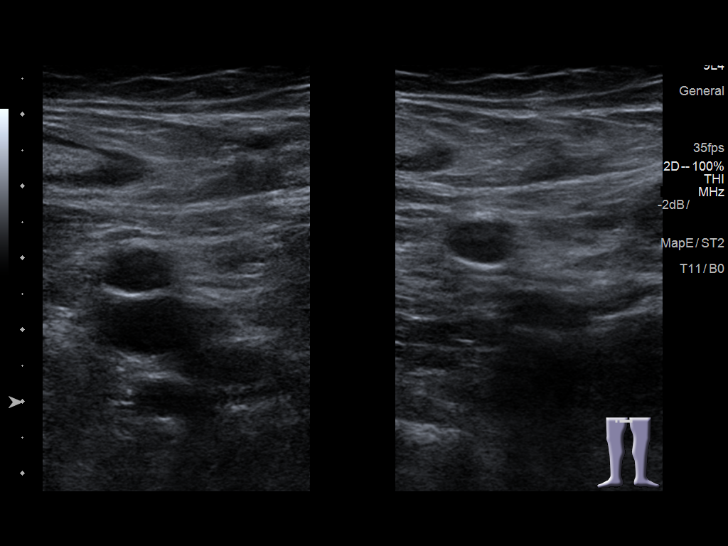
[im 14/45]
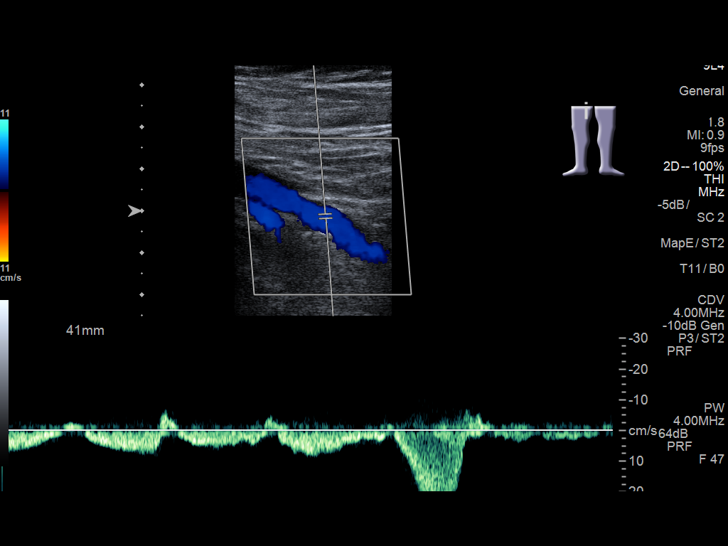
[im 18/45]
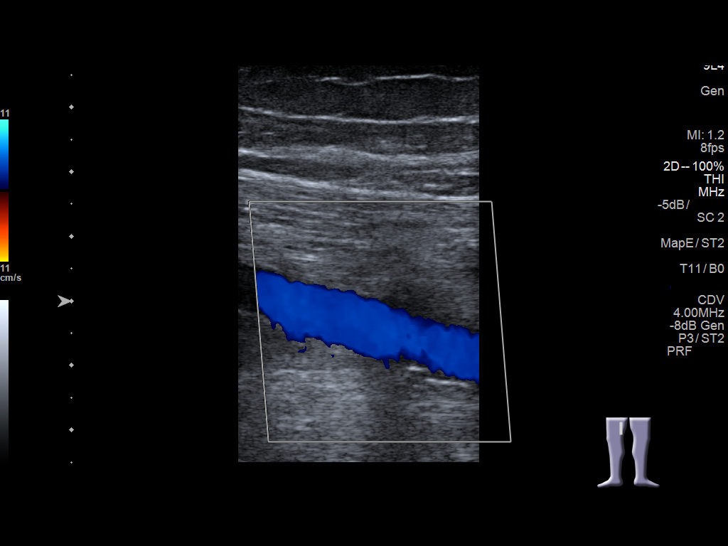
[im 22/45]
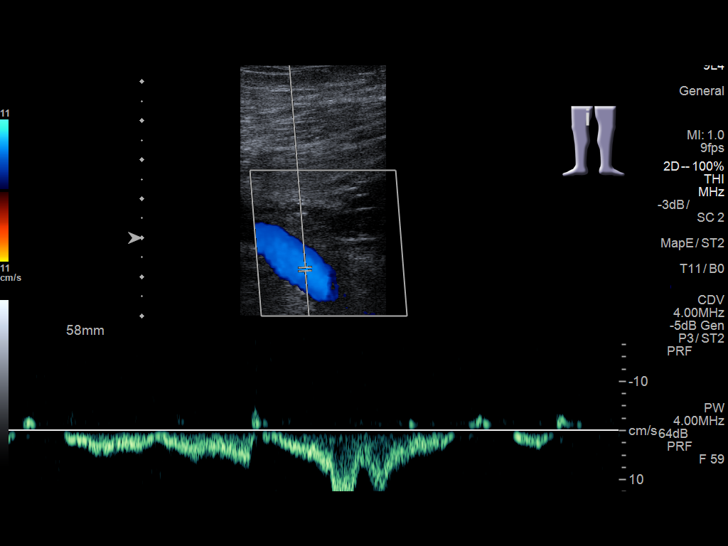
[im 23/45]
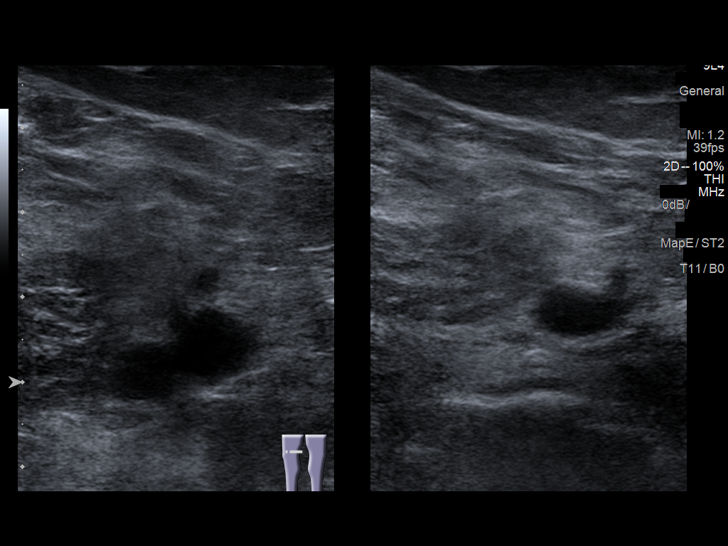
[im 27/45]
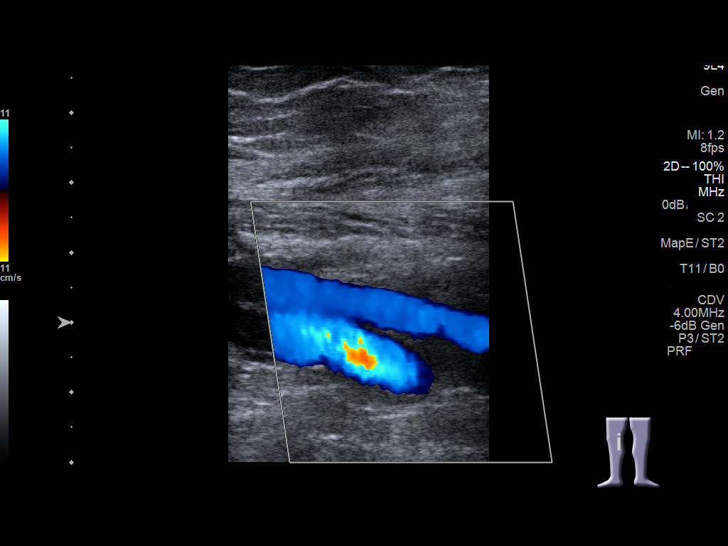
[im 31/45]
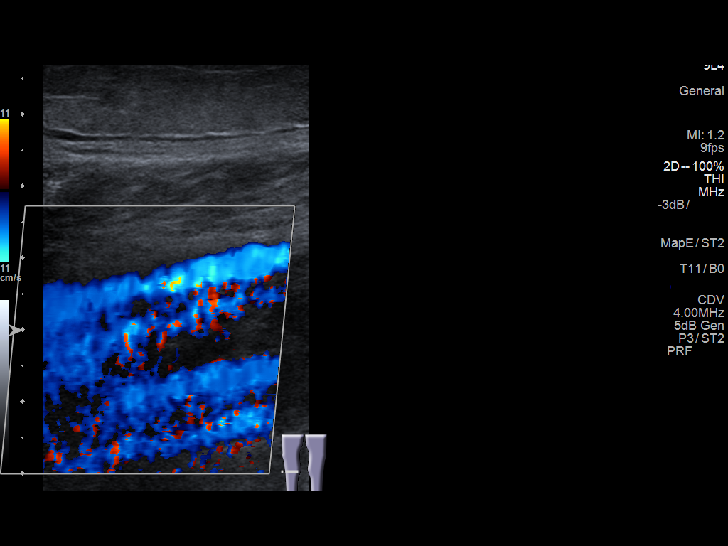
[im 35/45]
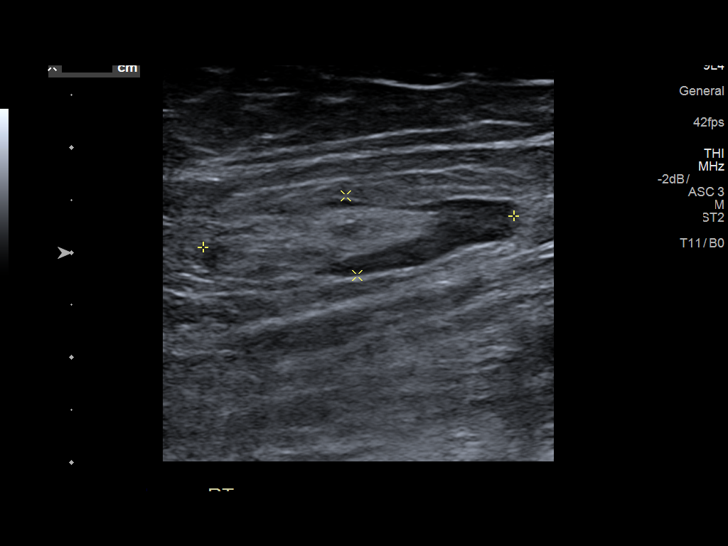
[im 37/45]
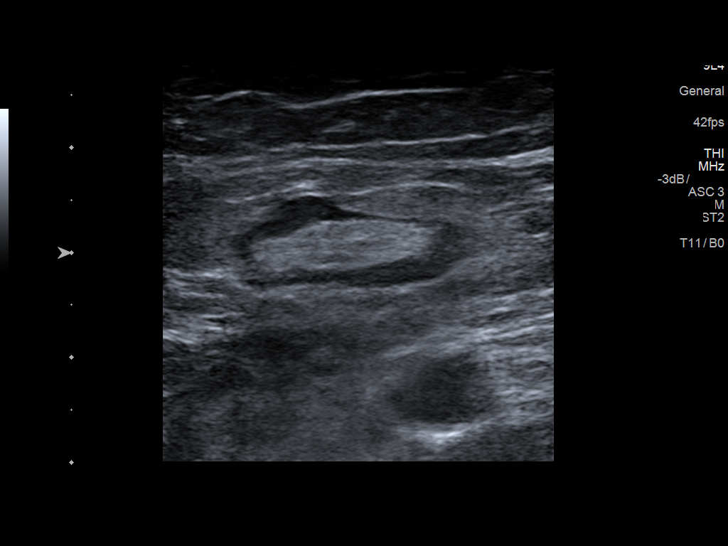
[im 41/45]
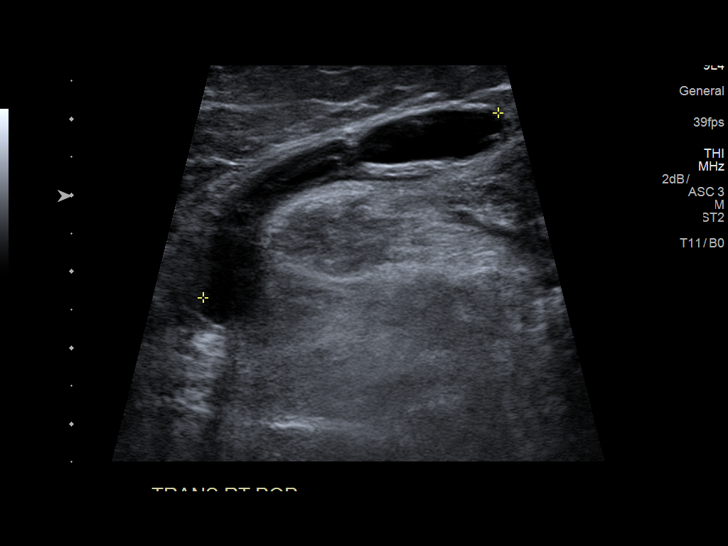
[im 45/45]
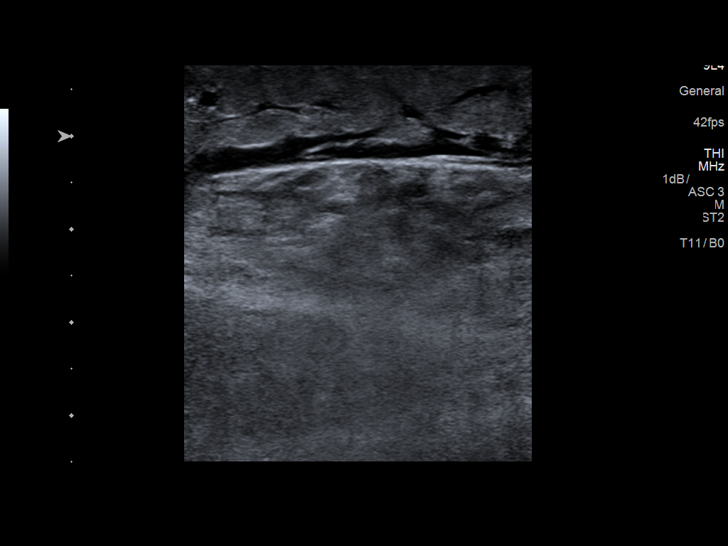

[14 of 24 positions shown; findings below may reference images not displayed]

FINDINGS: Normal compressibility of the common femoral, superficial femoral,
and popliteal veins, as well as the proximal calf veins. No filling
defects to suggest DVT on grayscale or color Doppler imaging.
Doppler waveforms show normal direction of venous flow, normal
respiratory phasicity and response to augmentation. Elongated 3 x
0.8 x 2 cm mildly complex fluid collection in the posterior
popliteal fossa. Subcutaneous edema in the calf. Survey views of the
contralateral common femoral vein are unremarkable.
IMPRESSION: 1.   No evidence of RIGHT lower extremity deep vein thrombosis.
2. Small right Baker's cyst.

## 2018-09-05 IMAGING — US US EXTREM LOW VENOUS*R*
1 series · 13 of 24 positions shown · non-contrast
Comparison: [DATE]

CLINICAL DATA: Right lower extremity pain and edema.



[Series 1: us extrem low venous*right* · 0.08mm/px · 13 of 73 slices shown]
[im 1/73]
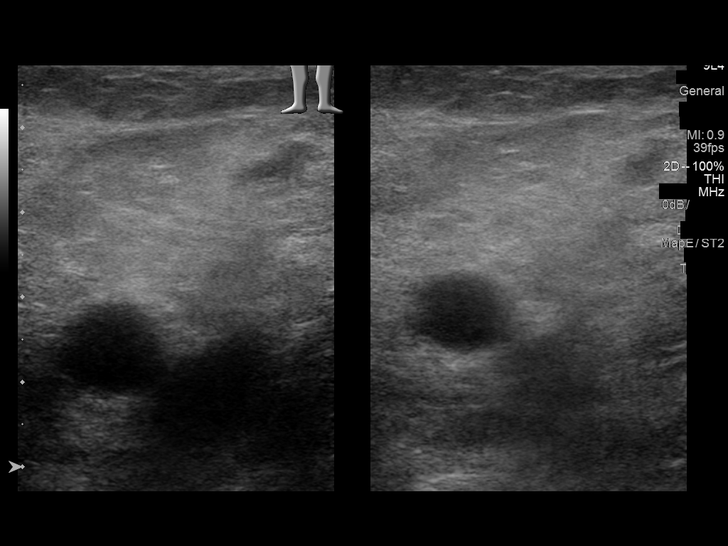
[im 7/73]
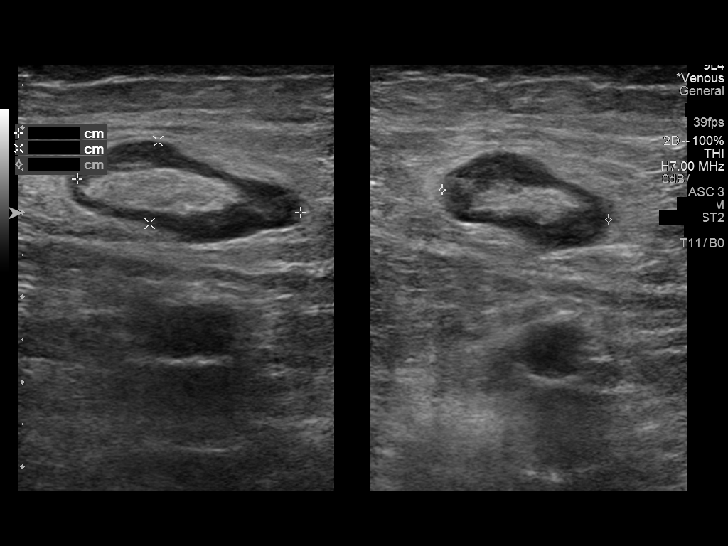
[im 13/73]
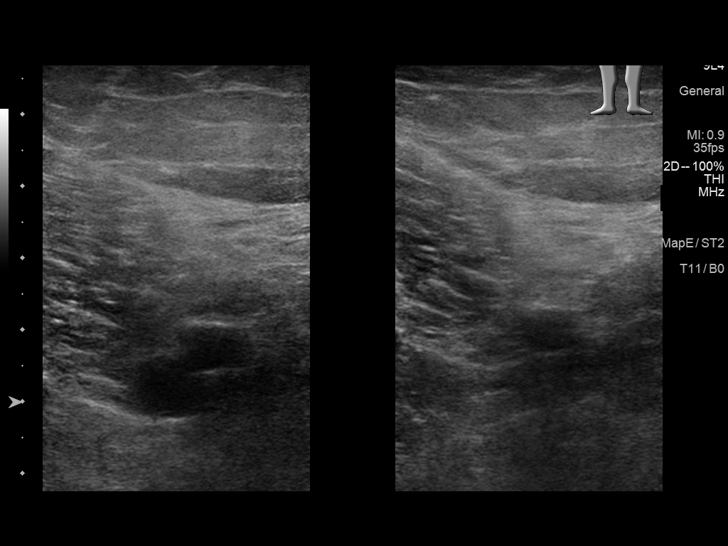
[im 19/73]
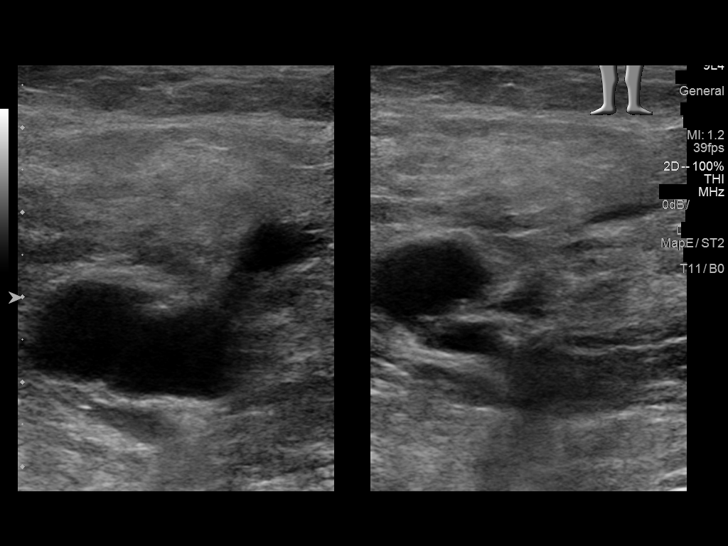
[im 26/73]
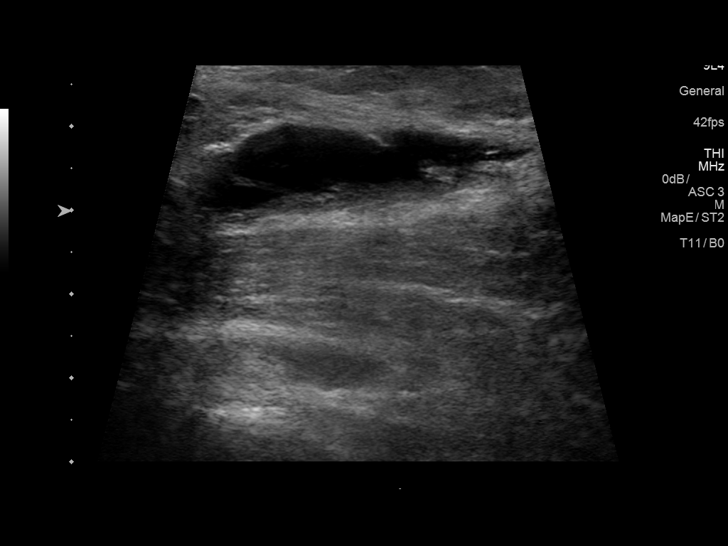
[im 32/73]
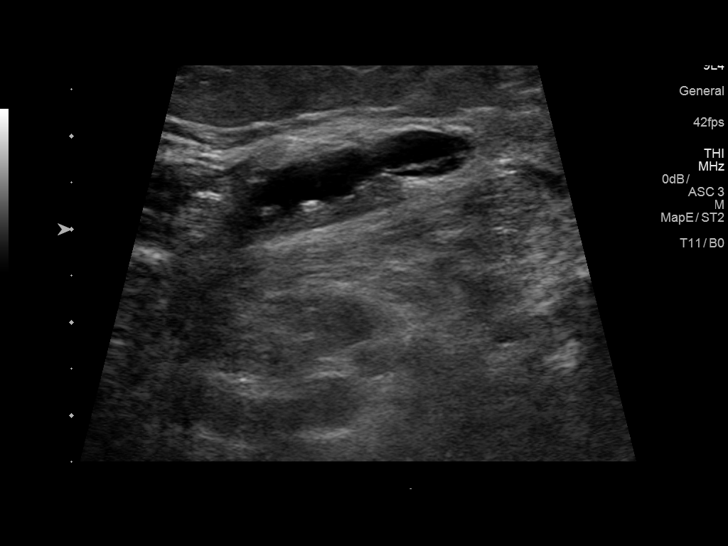
[im 38/73]
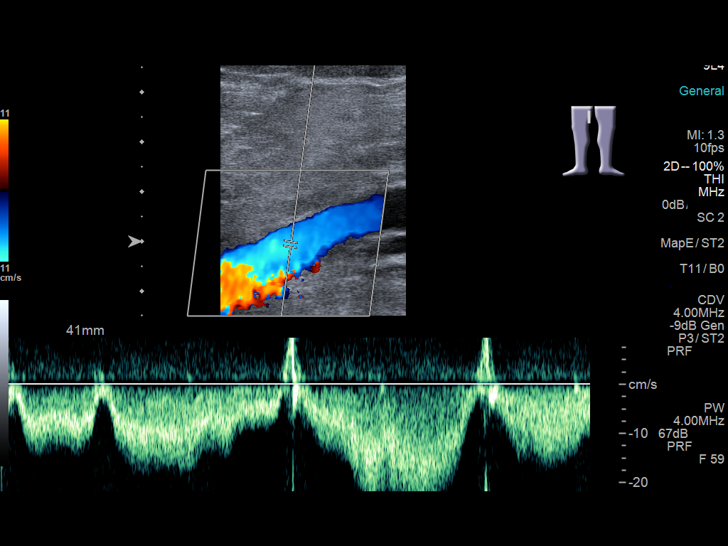
[im 41/73]
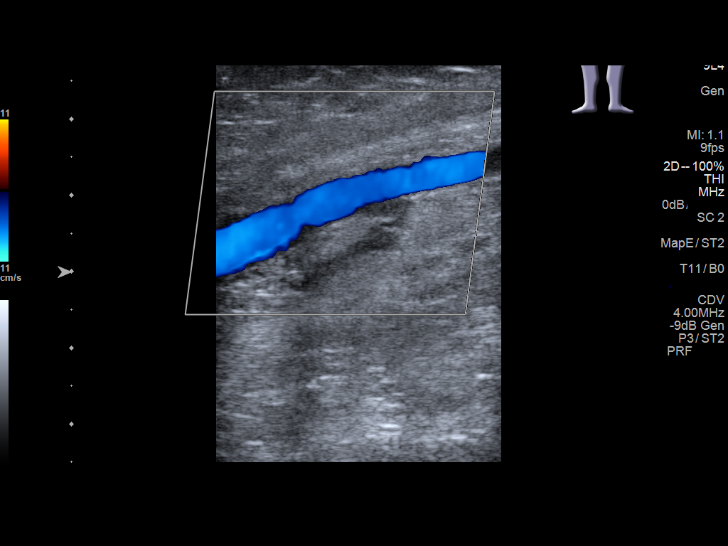
[im 47/73]
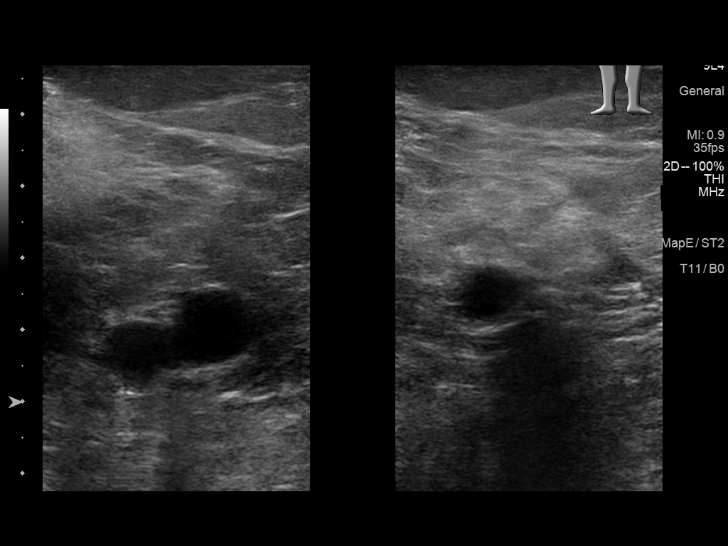
[im 54/73]
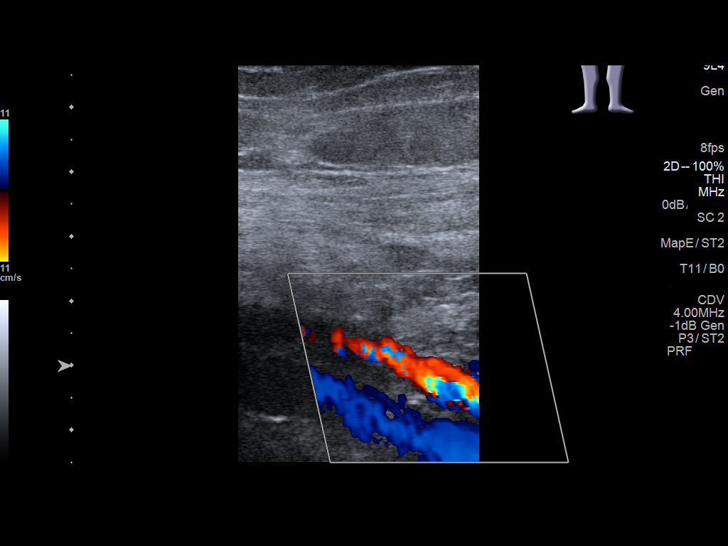
[im 60/73]
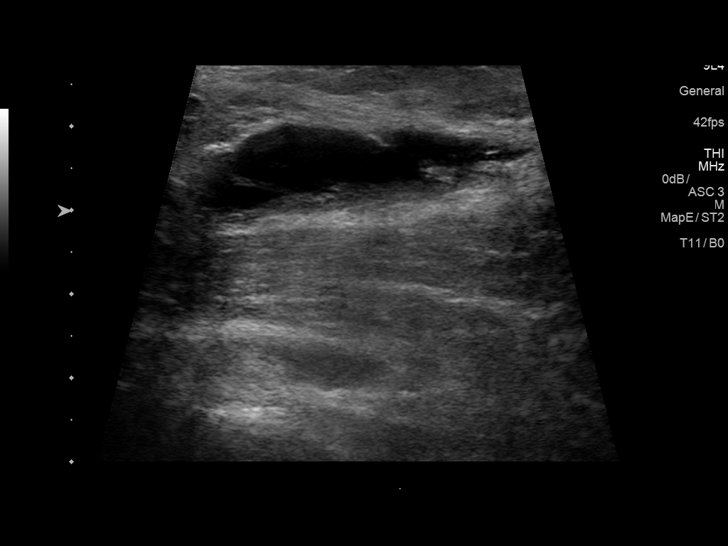
[im 66/73]
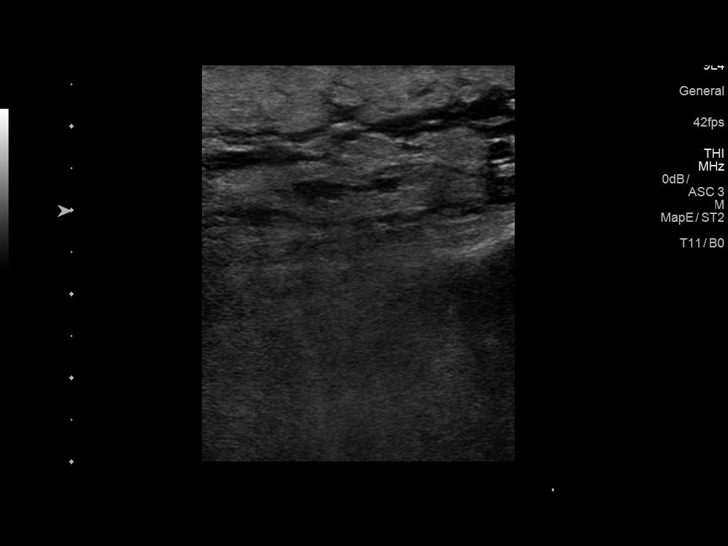
[im 73/73]
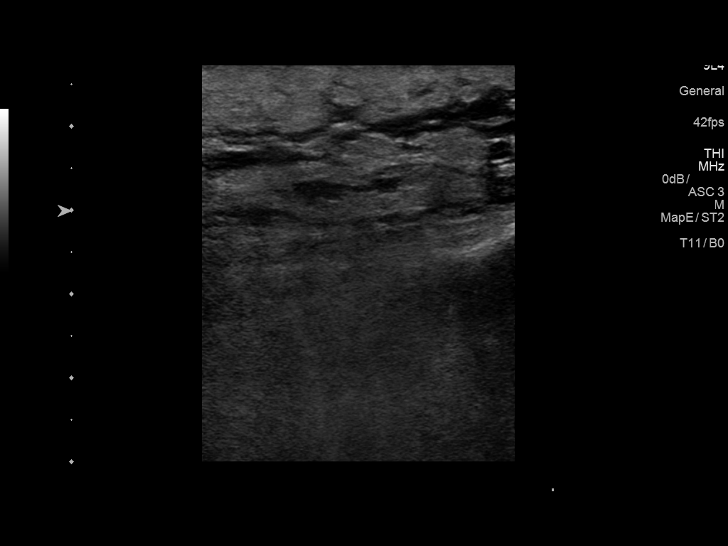

[13 of 24 positions shown; findings below may reference images not displayed]

FINDINGS: Contralateral Common Femoral Vein: Respiratory phasicity is normal
and symmetric with the symptomatic side. No evidence of thrombus.
Normal compressibility.

Common Femoral Vein: No evidence of thrombus. Normal
compressibility, respiratory phasicity and response to augmentation.

Saphenofemoral Junction: No evidence of thrombus. Normal
compressibility and flow on color Doppler imaging.

Profunda Femoral Vein: No evidence of thrombus. Normal
compressibility and flow on color Doppler imaging.

Femoral Vein: No evidence of thrombus. Normal compressibility,
respiratory phasicity and response to augmentation.

Popliteal Vein: No evidence of thrombus. Normal compressibility,
respiratory phasicity and response to augmentation.

Calf Veins: No evidence of thrombus. Normal compressibility and flow
on color Doppler imaging.

Superficial Great Saphenous Vein: No evidence of thrombus. Normal
compressibility.

Venous Reflux:  None.

Other Findings: No evidence of superficial thrombophlebitis. Right
popliteal fossa Baker's cyst again noted containing some partially
complex fluid. This Baker's cyst appears smaller compared to the
prior study and measures approximately 3.7 cm in greatest length and
1.1 cm in thickness.
IMPRESSION: 1. No evidence of right lower extremity deep venous thrombosis.
2. Continued evidence of a right popliteal fossa Baker's cyst which
appears slightly smaller compared to the prior ultrasound study in
[place].

## 2019-02-22 NOTE — Progress Notes (Signed)
[facility] Note  02/23/2019     CHIEF COMPLAINT Patient presents for Retina Follow Up   HISTORY OF PRESENT ILLNESS: Jeffery Beltran is a 78 y.o. male who presents to the clinic today for:   HPI    Retina Follow Up    Patient presents with  CRVO/BRVO.  In right eye.  Severity is severe.  Duration of 1 year.  Since onset it is stable.  I, the attending physician,  performed the HPI with the patient and updated documentation appropriately.          Comments    Patient states vision the same OU. Last a1c was 6.4 in April 2020. Due for re-check a1c in October 2020. Using azopt bid OS, latanoprost qhs OU, and brimonidine bid OU for IOP control per Dr. Vena AustriaJessica Oliver. Saw Dr. Joelene Millinliver end of June 2020.        Last edited by Rennis ChrisZamora, Heide Brossart, MD on 02/23/2019  9:05 AM. (History)    Pt states his vision seems to be stable from last year, pt states he saw Dr. Joelene Millinliver at the beginning of July, he states she did not change any of his glaucoma drops,  Referring physician: A. Melissa Solum [address] [place], [place] [address]    HISTORICAL INFORMATION:   Selected notes from the MEDICAL RECORD NUMBER Referral from Dr. 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 AZOPT 1 % ophthalmic suspension Place 1 drop into the left eye 2 (two) times daily.   . brimonidine (ALPHAGAN P) 0.1 % SOLN Apply 1 drop to eye 2 (two) times daily. 2 times daily in both eyes  . latanoprost (XALATAN) 0.005 % ophthalmic solution Place 1  drop into both eyes at bedtime.    No current facility-administered medications for this visit.  (Ophthalmic Drugs)   Current Outpatient Medications (Other)  Medication Sig  . aspirin 81 MG chewable tablet Chew 81 mg by mouth daily.  Marland Kitchen. HUMALOG KWIKPEN 100 UNIT/ML KiwkPen   . hydrochlorothiazide (HYDRODIURIL) 12.5 MG tablet Take by mouth.  . hydrochlorothiazide (MICROZIDE) 12.5 MG capsule   . Insulin Glargine 300 UNIT/ML SOPN Inject into the skin.  . Loratadine 10 MG CAPS Take by mouth.  . losartan (COZAAR) 100 MG tablet Take by mouth.  . metFORMIN (GLUCOPHAGE-XR) 500 MG 24 hr tablet Take one tablet at supper for first week, then increase to two tablets at supper.  . nebivolol (BYSTOLIC) 5 MG tablet Take by mouth.  Marland Kitchen. NOVOFINE 32G X 6 MM MISC   . ONE TOUCH ULTRA TEST test strip   . simvastatin (ZOCOR) 20 MG tablet    No current facility-administered medications for this visit.  (Other)      REVIEW OF SYSTEMS: ROS    Positive for: Endocrine, Eyes   Negative for: Constitutional, Gastrointestinal, Neurological, Skin, Genitourinary, Musculoskeletal, HENT, Cardiovascular, Respiratory, Psychiatric, Allergic/Imm, Heme/Lymph   Last edited by Annalee GentaBarber, Daryl D on 02/23/2019  8:51 AM. (History)  ALLERGIES No Known Allergies  PAST MEDICAL HISTORY Past Medical History:  Diagnosis Date  . Diabetes (HCC)   . Glaucoma   . Hypertension    Past Surgical History:  Procedure Laterality Date  . CATARACT EXTRACTION Right   . TRABECULECTOMY Right   . YAG LASER APPLICATION Right     FAMILY HISTORY Family History  Problem Relation Age of Onset  . Amblyopia Neg Hx   . Blindness Neg Hx   . Cataracts Neg Hx   . Glaucoma Neg Hx   . Macular degeneration Neg Hx   . Retinal detachment Neg Hx   . Strabismus Neg Hx   . Retinitis pigmentosa Neg Hx     SOCIAL HISTORY Social History   Tobacco Use  . Smoking status: Never Smoker  . Smokeless tobacco: Never Used  Substance Use Topics   . Alcohol use: Not on file  . Drug use: Not on file         OPHTHALMIC EXAM:  Base Eye Exam    Visual Acuity (Snellen - Linear)      Right Left   Dist cc 20/300 -1 20/30   Dist ph cc NI 20/25 -2   Correction: Glasses       Tonometry (Tonopen, 9:07 AM)      Right Left   Pressure 14 10       Pupils      Dark Light Shape React APD   Right 3 2 Round Brisk None   Left 3.5 2.5 Round Brisk None       Visual Fields (Counting fingers)      Left Right    Full Full       Extraocular Movement      Right Left    Full, Ortho Full, Ortho       Neuro/Psych    Oriented x3: Yes   Mood/Affect: Normal       Dilation    Both eyes: 1.0% Mydriacyl, 2.5% Phenylephrine @ 9:07 AM        Slit Lamp and Fundus Exam    External Exam      Right Left   External Normal Normal       Slit Lamp Exam      Right Left   Lids/Lashes Dermatochalasis - upper lid Dermatochalasis - upper lid, Telangiectasia   Conjunctiva/Sclera Bleb at 1200 White and quiet   Cornea 1+ Punctate epithelial erosions, Well healed cataract wounds Arcus, 1+Punctate epithelial erosions   Anterior Chamber Deep and quiet Deep and quiet   Iris Round and dilated, No NVI Round and moderately dilated   Lens Posterior chamber intraocular lens; open PC, mild anterior capsular fimosis 2+ Nuclear sclerosis, 2-3+ Cortical cataract   Vitreous Vitreous syneresis - mild Posterior vitreous detachment, Weiss ring       Fundus Exam      Right Left   Disc Pink and Sharp Pink and Sharp, temporal Peripapillary atrophy   C/D Ratio 0.1 0.1   Macula RPE central atrophy and clumping, No heme or edema Flat, blunted foveal reflex, mild Retinal pigment epithelial mottling   Vessels Vascular attenuation Vascular attenuation   Periphery Attached Attached        Refraction    Wearing Rx      Sphere Cylinder Axis Add   Right -0.75 +0.75 010 +3.00   Left -0.25 +1.00 169 +3.00   Type: Bifocal       Manifest Refraction      Sphere  Cylinder Axis  Dist VA   Right -0.75 +0.75 010 20/300   Left -0.75 +1.00 170 20/30          IMAGING AND PROCEDURES  Imaging and Procedures for 08/22/17  OCT, Retina - OU - Both Eyes       Right Eye Quality was good. Central Foveal Thickness: 234. Progression has been stable. Findings include abnormal foveal contour, epiretinal membrane, subretinal hyper-reflective material, outer retinal atrophy, no IRF, no SRF, macular pucker.   Left Eye Quality was good. Central Foveal Thickness: 325. Progression has been stable. Findings include normal foveal contour, no IRF, no SRF.   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 CME - cystoid macular edema. PED - pigment epithelial detachment. IRF - intraretinal fluid. SRF - subretinal fluid. EZ - ellipsoid zone. ERM - epiretinal membrane. ORA - outer retinal atrophy. ORT - outer retinal tubulation. SRHM - subretinal hyper-reflective material                  ASSESSMENT/PLAN:    ICD-10-CM   1. 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 Pseudophakia of right eye  Z96.1   3. History of trabeculectomy  Z98.890   4. Ocular hypertension, unspecified laterality  H40.059   5. Age-related nuclear cataract of left eye  H25.12   6. Type 2 diabetes mellitus without complication, unspecified whether long term insulin use (HCC)  E11.9   7. Epiretinal membrane (ERM) of right eye  H35.371   8. 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 Retinal edema  H35.81 OCT, Retina - OU - Both Eyes    1.  History of CRVO w/ CME OD  - baseline VA 20/250 -- slight decrease to 20/300 today  - history of intravitreal steroids and steroid response - see below  - OCT stable today, still with central foveal outer retinal atrophy but no  IRF/SRF/edema  - monitor  - F/U 9-12 months  2.  Pseudophakia OD  - s/p CE/IOL  - s/p yag capsulotomy  - beautiful surgeries, doing well  - monitor  3,4.  Steroid response / Ocular Hypertension s/p trabeculectomy OD  - IOP good OD today  - using Alphagan OU BID, Xalatan OU qd, Azopt OS BID as perscribed by Dr. Joelene Millinliver  - followed [facility]Jessica [facility] at [address], [address] [place], [place] [id number], telephone [phone] [phone]  - monitor   5.  NSC/CC OS  - The symptoms of cataract, surgical options, and treatments and risks were discussed with patient.  - discussed diagnosis and progression  - not yet visually significant  - monitor for now  6. Diabetes mellitus, type 2 without retinopathy  - The incidence, risk factors for progression, natural history and treatment options for diabetic retinopathy  were discussed with patient.    - The need for close monitoring of blood glucose, blood pressure, and serum lipids, avoiding cigarette or any type of tobacco, and the need for long term follow up was also discussed with patient.  - f/u in 1 year, sooner prn  7. ERM OD  - mild ERM/pucker in right eye that is limited by central atrophy 2/2 h/o CRVO  - monitor -- no indication for surgery at this time  -  will discuss if symptoms develop or vision worsens OD  8. Optic disc drusen OS  - discussed diagnosis and significance to vision  - not visually significant at this point  - will monitor for now    Ophthalmic Meds Ordered this visit:  No orders of the defined types were placed in this encounter.      Return in about 1 year (around 02/23/2020) for f/u history of CRVO with CME OD, DFE, OCT.  There are no Patient Instructions on file for this visit.   Explained the diagnoses, plan, and follow up with the patient and they expressed understanding.  Patient expressed understanding of the importance of proper follow  up care.   This document serves as a record of services personally performed by Gardiner Sleeper, MD, PhD. It was created on their behalf by Ernest Mallick, OA, an ophthalmic assistant. The creation of this record is the provider's dictation and/or activities during the visit.    Electronically signed by: Ernest Mallick, OA  08.17.2020 11:48 PM    Gardiner Sleeper, M.D., Ph.D. Vitreoretinal Surgeon [facility]  I have reviewed the above documentation for accuracy and completeness, and I agree with the above. Gardiner Sleeper, M.D., Ph.D. 02/23/19 11:50 PM    Abbreviations: M myopia (nearsighted); A astigmatism; H hyperopia (farsighted); P presbyopia; Mrx spectacle prescription;  CTL contact lenses; OD right eye; OS left eye; OU both eyes  XT exotropia; ET esotropia; PEK punctate epithelial keratitis; PEE punctate epithelial erosions; DES dry eye syndrome; MGD meibomian gland dysfunction; ATs artificial tears; PFAT's preservative free artificial tears; [place] nuclear sclerotic cataract; PSC posterior subcapsular cataract; ERM epi-retinal membrane; PVD posterior vitreous detachment; RD retinal detachment; DM diabetes mellitus; DR diabetic retinopathy; NPDR non-proliferative diabetic retinopathy; PDR proliferative diabetic retinopathy; CSME clinically significant macular edema; DME diabetic macular edema; dbh dot blot hemorrhages; CWS cotton wool spot; POAG primary open angle glaucoma; C/D cup-to-disc ratio; HVF humphrey visual field; GVF goldmann visual field; OCT optical coherence tomography; IOP intraocular pressure; BRVO Branch retinal vein occlusion; CRVO central retinal vein occlusion; CRAO central retinal artery occlusion; BRAO branch retinal artery occlusion; RT retinal tear; SB scleral buckle; PPV pars plana vitrectomy; VH Vitreous hemorrhage; PRP panretinal laser photocoagulation; IVK intravitreal kenalog; VMT vitreomacular traction; MH Macular hole;  NVD neovascularization of the  disc; NVE neovascularization elsewhere; AREDS age related eye disease study; ARMD age related macular degeneration; POAG primary open angle glaucoma; EBMD epithelial/anterior basement membrane dystrophy; ACIOL anterior chamber intraocular lens; IOL intraocular lens; PCIOL posterior chamber intraocular lens; Phaco/IOL phacoemulsification with intraocular lens placement; [place] photorefractive keratectomy; LASIK laser assisted in situ keratomileusis; HTN hypertension; DM diabetes mellitus; COPD chronic obstructive pulmonary disease

## 2019-02-23 ENCOUNTER — Encounter (INDEPENDENT_AMBULATORY_CARE_PROVIDER_SITE_OTHER): Payer: Self-pay | Admitting: Ophthalmology

## 2019-02-23 ENCOUNTER — Ambulatory Visit (INDEPENDENT_AMBULATORY_CARE_PROVIDER_SITE_OTHER): Payer: Medicare Other | Admitting: Ophthalmology

## 2019-02-23 ENCOUNTER — Other Ambulatory Visit: Payer: Self-pay

## 2019-02-23 DIAGNOSIS — H35371 Puckering of macula, right eye: Secondary | ICD-10-CM

## 2019-02-23 DIAGNOSIS — Z961 Presence of intraocular lens: Secondary | ICD-10-CM

## 2019-02-23 DIAGNOSIS — H47322 Drusen of optic disc, left eye: Secondary | ICD-10-CM

## 2019-02-23 DIAGNOSIS — Z9889 Other specified postprocedural states: Secondary | ICD-10-CM | POA: Diagnosis not present

## 2019-02-23 DIAGNOSIS — H3581 Retinal edema: Secondary | ICD-10-CM | POA: Diagnosis not present

## 2019-02-23 DIAGNOSIS — H34811 Central retinal vein occlusion, right eye, with macular edema: Secondary | ICD-10-CM

## 2019-02-23 DIAGNOSIS — E119 Type 2 diabetes mellitus without complications: Secondary | ICD-10-CM

## 2019-02-23 DIAGNOSIS — H2512 Age-related nuclear cataract, left eye: Secondary | ICD-10-CM

## 2019-02-23 DIAGNOSIS — H40059 Ocular hypertension, unspecified eye: Secondary | ICD-10-CM

## 2019-05-06 DIAGNOSIS — N182 Chronic kidney disease, stage 2 (mild): Secondary | ICD-10-CM | POA: Insufficient documentation

## 2020-02-21 NOTE — Progress Notes (Signed)
[facility] Note  02/23/2020     CHIEF COMPLAINT Patient presents for Retina Follow Up   HISTORY OF PRESENT ILLNESS: Jeffery Beltran is a 79 y.o. male who presents to the clinic today for:   HPI    Retina Follow Up    Patient presents with  CRVO/BRVO.  In right eye.  This started years ago.  Severity is moderate.  Duration of 1 year.  Since onset it is stable.  I, the attending physician,  performed the HPI with the patient and updated documentation appropriately.          Comments    79 y/o male pt here for 1 yr f/u for CRVO w/mac edema OD.  No change in [place] OU.  Denies pain, FOL, floaters.  Alphagan BID OU, Xalatan QD OU, Azopt BID OS       Last edited by [name], MD on 02/23/2020  9:57 AM. (History)    pt has not seen Dr. Demetrios Loll recently, but has an appt with her in October, he saw one of her associates in April and states no changes were made, he does not have any new health concerns and is on the same drop schedule  Referring physician: A. Melissa Solum [place], [place] [address]    HISTORICAL INFORMATION:   Selected notes from the MEDICAL RECORD NUMBER Referral from Dr. 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    brimonidine (ALPHAGAN P) 0.1 % SOLN Apply 1 drop to eye 2 (two) times daily. 2 times daily in both eyes   latanoprost  (XALATAN) 0.005 % ophthalmic solution Place 1 drop into both eyes at bedtime.    No current facility-administered medications for this visit. (Ophthalmic Drugs)   Current Outpatient Medications (Other)  Medication Sig   aspirin 81 MG chewable tablet Chew 81 mg by mouth daily.   HUMALOG KWIKPEN 100 UNIT/ML KiwkPen    hydrochlorothiazide (HYDRODIURIL) 12.5 MG tablet Take by mouth.   hydrochlorothiazide (MICROZIDE) 12.5 MG capsule    Insulin Glargine 300 UNIT/ML SOPN Inject into the skin.   Loratadine 10 MG CAPS Take by mouth.   losartan (COZAAR) 100 MG tablet Take by mouth.   metFORMIN (GLUCOPHAGE-XR) 500 MG 24 hr tablet Take one tablet at supper for first week, then increase to two tablets at supper.   nebivolol (BYSTOLIC) 5 MG tablet Take by mouth.   NOVOFINE 32G X 6 MM MISC    ONE TOUCH ULTRA TEST test strip    simvastatin (ZOCOR) 20 MG tablet    triamterene-hydrochlorothiazide (MAXZIDE-25) 37.5-25 MG tablet Take 1 tablet by mouth daily.   No current facility-administered medications for this visit. (Other)      REVIEW OF SYSTEMS: ROS    Positive for: Endocrine, Eyes   Negative for: Constitutional,  Gastrointestinal, Neurological, Skin, Genitourinary, Musculoskeletal, HENT, Cardiovascular, Respiratory, Psychiatric, Allergic/Imm, Heme/Lymph   Last edited by Matthew Folks, COA on 02/23/2020  9:03 AM. (History)       ALLERGIES No Known Allergies  PAST MEDICAL HISTORY Past Medical History:  Diagnosis Date   Cataract    Mixed form OS   Diabetes ([place])    Glaucoma    Hypertension    Past Surgical History:  Procedure Laterality Date   CATARACT EXTRACTION Right    EYE SURGERY Right    Cat Sx   EYE SURGERY Right    Trab   IRIDOTOMY / IRIDECTOMY Right    Trab   TRABECULECTOMY Right    YAG LASER APPLICATION Right     FAMILY HISTORY Family History  Problem Relation Age of Onset   Amblyopia Neg Hx    Blindness Neg Hx    Cataracts Neg Hx     Glaucoma Neg Hx    Macular degeneration Neg Hx    Retinal detachment Neg Hx    Strabismus Neg Hx    Retinitis pigmentosa Neg Hx     SOCIAL HISTORY Social History   Tobacco Use   Smoking status: Never Smoker   Smokeless tobacco: Never Used  Vaping Use   Vaping Use: Never used  Substance Use Topics   Alcohol use: Not on file   Drug use: Not on file         OPHTHALMIC EXAM:  Base Eye Exam    Visual Acuity (Snellen - Linear)      Right Left   Dist cc 20/300 - 20/30 -2   Dist ph cc NI 20/20 -2   Correction: Glasses       Tonometry (Tonopen, 9:08 AM)      Right Left   Pressure 13 10       Pupils      Dark Light Shape React APD   Right 3 2 Round Brisk None   Left 3 2 Round Brisk None       Visual Fields (Counting fingers)      Left Right    Full Full       Extraocular Movement      Right Left    Full, Ortho Full, Ortho       Neuro/Psych    Oriented x3: Yes   Mood/Affect: Normal       Dilation    Both eyes: 1.0% Mydriacyl, 2.5% Phenylephrine @ 9:08 AM        Slit Lamp and Fundus Exam    External Exam      Right Left   External Normal Normal       Slit Lamp Exam      Right Left   Lids/Lashes Dermatochalasis - upper lid Dermatochalasis - upper lid, mild Meibomian gland dysfunction   Conjunctiva/Sclera Bleb at 1200 White and quiet   Cornea 2-3+ Punctate epithelial erosions, Well healed cataract wounds Arcus, 1+Punctate epithelial erosions   Anterior Chamber Deep and quiet Deep and quiet   Iris Round and moderately dilated, No NVI Round and moderately dilated   Lens Posterior chamber intraocular lens; open PC, mild anterior capsular phimosis 2-3+ Nuclear sclerosis, 2-3+ Cortical cataract   Vitreous Vitreous syneresis - mild Posterior vitreous detachment, Weiss ring, Vitreous syneresis       Fundus Exam      Right Left   Disc Pink and Sharp, Compact Pink and Sharp, compact, temporal Peripapillary atrophy   C/D Ratio 0.1 0.1  Macula  Blunted foveal reflex, central RPE mottling, clumping and atrophy, mild ERM temporal macula, No heme or edema Flat, blunted foveal reflex, mild Retinal pigment epithelial mottling and clumping    Vessels Vascular attenuation Vascular attenuation, mild tortuousity   Periphery Attached Attached          IMAGING AND PROCEDURES  Imaging and Procedures for 08/22/17  OCT, Retina - OU - Both Eyes       Right Eye Quality was good. Central Foveal Thickness: 235. Progression has been stable. Findings include abnormal foveal contour, epiretinal membrane, subretinal hyper-reflective material, outer retinal atrophy, no IRF, no SRF, macular pucker, retinal drusen .   Left Eye Quality was good. Central Foveal Thickness: 335. Progression has been stable. Findings include normal foveal contour, no IRF, no SRF.   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 CME - cystoid macular edema. PED - pigment epithelial detachment. IRF - intraretinal fluid. SRF - subretinal fluid. EZ - ellipsoid zone. ERM - epiretinal membrane. ORA - outer retinal atrophy. ORT - outer retinal tubulation. SRHM - subretinal hyper-reflective material                  ASSESSMENT/PLAN:    ICD-10-CM   1. 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 Retinal edema  H35.81 OCT, Retina - OU - Both Eyes  3. Pseudophakia of right eye  Z96.1   4. Ocular hypertension, unspecified laterality  H40.059   5. History of trabeculectomy  Z98.890   6. 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 Type 2 diabetes mellitus without complication, unspecified whether long term insulin use (HCC)  E11.9   8. Epiretinal membrane (ERM) of right eye  H35.371   9. 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  History of CRVO  w/ CME OD  - [facility] stable at 20/300- today  - history of intravitreal steroids and steroid response - see below  - OCT stable today: central foveal outer retinal atrophy but no IRF/SRF/edema  - monitor  - F/U 9-12 months  3.  Pseudophakia OD  - s/p CE/IOL OD  - s/p yag capsulotomy  - beautiful surgeries, doing well  - monitor  4,5.  Steroid response / Ocular Hypertension s/p trabeculectomy OD  - IOP good OD today -- 13 mmHg  - using Alphagan OU BID, Xalatan OU qd, Azopt OS BID as perscribed by Dr. Danne Baxter  - followed by Dr. Demetrios Loll at [facility] at [address], [place] [address], telephone [phone] [phone]  - monitor   6.  NSC/CC OS  - The symptoms of cataract, surgical options, and treatments and risks were discussed with patient.  - discussed diagnosis and progression  - monitor for now  7. Diabetes mellitus, type 2 without retinopathy  - The incidence, risk factors for progression, natural history and treatment options for diabetic retinopathy  were discussed with patient.    - The need for close monitoring of blood glucose, blood pressure, and serum lipids, avoiding cigarette or any type of tobacco, and the need for long term follow up was also discussed with patient.  - f/u in 1 year, sooner prn  8. ERM OD  - mild ERM/pucker  in right eye that is limited by central atrophy 2/2 h/o CRVO  - monitor -- no indication for surgery at this time  - will discuss if symptoms develop or symptoms worsen OD  9. Optic disc drusen OS  - discussed diagnosis and significance to vision  - not visually significant at this point  - monitor   Ophthalmic Meds Ordered this visit:  No orders of the defined types were placed in this encounter.      Return in about 1 year (around 02/22/2021) for f/u hx of CRVO OD, DFE, OCT.  There are no Patient Instructions on file for this visit.   Explained the diagnoses, plan, and  follow up with the patient and they expressed understanding.  Patient expressed understanding of the importance of proper follow up care.   This document serves as a record of services personally performed by Gardiner Sleeper, MD, PhD. It was created on their behalf by Roselee Nova, COMT. The creation of this record is the provider's dictation and/or activities during the visit.  Electronically signed by: Roselee Nova, COMT 02/23/20 11:34 PM   This document serves as a record of services personally performed by Gardiner Sleeper, MD, PhD. It was created on their behalf by San Jetty. Owens Shark, OA an ophthalmic technician. The creation of this record is the provider's dictation and/or activities during the visit.    Electronically signed by: San Jetty. Marguerita Merles 08.18.2021 11:34 PM  Gardiner Sleeper, M.D., Ph.D. Vitreoretinal Surgeon [facility]  I have reviewed the above documentation for accuracy and completeness, and I agree with the above. Gardiner Sleeper, M.D., Ph.D. 02/23/20 11:34 PM   Abbreviations: M myopia (nearsighted); A astigmatism; H hyperopia (farsighted); P presbyopia; Mrx spectacle prescription;  CTL contact lenses; OD right eye; OS left eye; OU both eyes  XT exotropia; ET esotropia; PEK punctate epithelial keratitis; PEE punctate epithelial erosions; DES dry eye syndrome; MGD meibomian gland dysfunction; ATs artificial tears; PFAT's preservative free artificial tears; [place] nuclear sclerotic cataract; PSC posterior subcapsular cataract; ERM epi-retinal membrane; PVD posterior vitreous detachment; RD retinal detachment; DM diabetes mellitus; DR diabetic retinopathy; NPDR non-proliferative diabetic retinopathy; PDR proliferative diabetic retinopathy; CSME clinically significant macular edema; DME diabetic macular edema; dbh dot blot hemorrhages; CWS cotton wool spot; POAG primary open angle glaucoma; C/D cup-to-disc ratio; HVF humphrey visual field; GVF goldmann visual  field; OCT optical coherence tomography; IOP intraocular pressure; BRVO Branch retinal vein occlusion; CRVO central retinal vein occlusion; CRAO central retinal artery occlusion; BRAO branch retinal artery occlusion; RT retinal tear; SB scleral buckle; PPV pars plana vitrectomy; VH Vitreous hemorrhage; PRP panretinal laser photocoagulation; IVK intravitreal kenalog; VMT vitreomacular traction; MH Macular hole;  NVD neovascularization of the disc; NVE neovascularization elsewhere; AREDS age related eye disease study; ARMD age related macular degeneration; POAG primary open angle glaucoma; EBMD epithelial/anterior basement membrane dystrophy; ACIOL anterior chamber intraocular lens; IOL intraocular lens; PCIOL posterior chamber intraocular lens; Phaco/IOL phacoemulsification with intraocular lens placement; [place] photorefractive keratectomy; LASIK laser assisted in situ keratomileusis; HTN hypertension; DM diabetes mellitus; COPD chronic obstructive pulmonary disease

## 2020-02-23 ENCOUNTER — Other Ambulatory Visit: Payer: Self-pay

## 2020-02-23 ENCOUNTER — Ambulatory Visit (INDEPENDENT_AMBULATORY_CARE_PROVIDER_SITE_OTHER): Payer: Medicare Other | Admitting: Ophthalmology

## 2020-02-23 ENCOUNTER — Encounter (INDEPENDENT_AMBULATORY_CARE_PROVIDER_SITE_OTHER): Payer: Self-pay | Admitting: Ophthalmology

## 2020-02-23 DIAGNOSIS — Z961 Presence of intraocular lens: Secondary | ICD-10-CM

## 2020-02-23 DIAGNOSIS — H47322 Drusen of optic disc, left eye: Secondary | ICD-10-CM

## 2020-02-23 DIAGNOSIS — H3581 Retinal edema: Secondary | ICD-10-CM | POA: Diagnosis not present

## 2020-02-23 DIAGNOSIS — H2512 Age-related nuclear cataract, left eye: Secondary | ICD-10-CM

## 2020-02-23 DIAGNOSIS — H40059 Ocular hypertension, unspecified eye: Secondary | ICD-10-CM | POA: Diagnosis not present

## 2020-02-23 DIAGNOSIS — H34811 Central retinal vein occlusion, right eye, with macular edema: Secondary | ICD-10-CM

## 2020-02-23 DIAGNOSIS — E119 Type 2 diabetes mellitus without complications: Secondary | ICD-10-CM

## 2020-02-23 DIAGNOSIS — H35371 Puckering of macula, right eye: Secondary | ICD-10-CM

## 2020-02-23 DIAGNOSIS — Z9889 Other specified postprocedural states: Secondary | ICD-10-CM

## 2021-02-21 NOTE — Progress Notes (Signed)
[facility] Note  02/22/2021     CHIEF COMPLAINT Patient presents for Retina Follow Up   HISTORY OF PRESENT ILLNESS: Jeffery BatheMartin Beltran is a 80 y.o. male who presents to the clinic today for:   HPI     Retina Follow Up   Patient presents with  CRVO/BRVO.  In right eye.  Duration of 12 months.        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 Joelene Millinliver 6 weeks ago.  Suture was removed at 4 weeks.  Dr. Joelene Millinliver wanted patient to be seen by Dr. Vanessa BarbaraZamora before giving post op glasses.  Using Alphagan P BID OU, Azopt BID OU, Xalatan qhs OD.  BS 122 this morning A1C 7.06 Nov 2020      Last edited by Joni ReiningHodges, Robin, COA on 02/22/2021  9:01 AM.    Pt had cataract sx OS with Dr. Joelene Millinliver at the end of June, pt states he has been off drops since the beginning of August, pt states last time he saw Dr. Joelene Millinliver his vision was 20/25 in the left eye, pt states Dr. Joelene Millinliver wanted him to have an appt with Dr. Vanessa BarbaraZamora before she gave him a rx for new glasses  Referring physician: A. Melissa Solum [address] [place], [place] [id number]    HISTORICAL INFORMATION:   Selected notes from the MEDICAL RECORD NUMBER Referral from Dr. 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    Bromfenac Sodium (PROLENSA) 0.07 % SOLN Place 1 drop into the left eye 4 (four) times daily.    latanoprost (XALATAN) 0.005 % ophthalmic solution Place 1 drop into both eyes at bedtime.    Loteprednol Etabonate (LOTEMAX SM) 0.38 % GEL Place 1 drop into the left eye 4 (four) times daily.   brimonidine (ALPHAGAN P) 0.1 % SOLN Apply 1 drop to eye 2 (two) times daily. 2 times daily in both eyes   No current facility-administered medications for this visit. (Ophthalmic Drugs)   Current Outpatient Medications (Other)  Medication Sig   aspirin 81 MG chewable tablet Chew 81 mg by mouth daily.   Dapagliflozin Propanediol (FARXIGA PO) Take 10 mg/day by mouth daily.   HUMALOG KWIKPEN 100 UNIT/ML KiwkPen    hydrochlorothiazide (HYDRODIURIL) 12.5 MG tablet Take by mouth.   hydrochlorothiazide (MICROZIDE) 12.5 MG capsule    Insulin Glargine 300 UNIT/ML SOPN Inject into the skin.   Loratadine 10 MG CAPS Take by mouth.   losartan (COZAAR) 100 MG tablet Take by mouth.   metFORMIN (GLUCOPHAGE-XR) 500 MG 24 hr tablet Take one tablet at supper for first week, then increase to two tablets at supper.   nebivolol (BYSTOLIC) 5 MG tablet Take by mouth.   simvastatin (ZOCOR) 20 MG tablet  triamterene-hydrochlorothiazide (MAXZIDE-25) 37.5-25 MG tablet Take 1 tablet by mouth daily.   NOVOFINE 32G X 6 MM MISC    ONE TOUCH ULTRA TEST test strip    No current facility-administered medications for this visit. (Other)      REVIEW OF SYSTEMS: ROS   Positive for: Endocrine, Eyes Negative for: Constitutional, Gastrointestinal, Neurological, Skin, Genitourinary, Musculoskeletal, HENT, Cardiovascular, Respiratory, Psychiatric, Allergic/Imm, Heme/Lymph Last edited by Joni Reining, COA on 02/22/2021  8:58 AM.        ALLERGIES No Known Allergies  PAST MEDICAL HISTORY Past Medical History:  Diagnosis Date   Cataract    Mixed form OS   Diabetes (HCC)    Glaucoma    Hypertension    Past Surgical History:  Procedure Laterality Date   CATARACT EXTRACTION Right    EYE SURGERY Right    Cat Sx   EYE  SURGERY Right    Trab   IRIDOTOMY / IRIDECTOMY Right    Trab   TRABECULECTOMY Right    YAG LASER APPLICATION Right     FAMILY HISTORY Family History  Problem Relation Age of Onset   Amblyopia Neg Hx    Blindness Neg Hx    Cataracts Neg Hx    Glaucoma Neg Hx    Macular degeneration Neg Hx    Retinal detachment Neg Hx    Strabismus Neg Hx    Retinitis pigmentosa Neg Hx     SOCIAL HISTORY Social History   Tobacco Use   Smoking status: Never   Smokeless tobacco: Never  Vaping Use   Vaping Use: Never used         OPHTHALMIC EXAM:  Base Eye Exam     Visual Acuity (Snellen - Linear)       Right Left   Dist cc 20/300 -1 20/25 -2   Dist ph cc NI 20/25         Tonometry (Tonopen, 9:06 AM)       Right Left   Pressure 17 15         Pupils       Dark Light Shape React APD   Right 3 2 Round Brisk None   Left 3 2 Round Brisk None         Visual Fields (Counting fingers)       Left Right    Full Full         Extraocular Movement       Right Left    Full Full         Neuro/Psych     Oriented x3: Yes   Mood/Affect: Normal         Dilation     Both eyes: 1.0% Mydriacyl, 2.5% Phenylephrine @ 9:06 AM           Slit Lamp and Fundus Exam     External Exam       Right Left   External Normal Normal         Slit Lamp Exam       Right Left   Lids/Lashes Dermatochalasis - upper lid Dermatochalasis - upper lid, mild Meibomian gland dysfunction   Conjunctiva/Sclera Bleb at 1200 White and quiet   Cornea Trace Punctate epithelial erosions, Well healed cataract wounds, arcus Arcus, 1+Punctate epithelial erosions, well healed cataract wound, trace Descemet's folds   Anterior Chamber Deep and quiet Deep, 0.5+cell/pigment   Iris Round and moderately dilated, No NVI Round and moderately dilated   Lens Posterior chamber intraocular lens; open  PC, mild anterior capsular phimosis PC IOL in good position   Vitreous Vitreous syneresis - mild  Posterior vitreous detachment, Weiss ring, Vitreous syneresis         Fundus Exam       Right Left   Disc Pink and Sharp, Compact Pink and Sharp, compact, temporal Peripapillary atrophy, +disc drusen   C/D Ratio 0.1 0.1   Macula Blunted foveal reflex, central RPE mottling, clumping and atrophy, mild ERM temporal macula, No heme or edema Flat, blunted foveal reflex, mild Retinal pigment epithelial mottling and clumping , trace Cystic changes   Vessels Vascular attenuation Vascular attenuation, mild tortuousity   Periphery Attached Attached, mild reticular degeneration            IMAGING AND PROCEDURES  Imaging and Procedures for 08/22/17  OCT, Retina - OU - Both Eyes       Right Eye Quality was good. Central Foveal Thickness: 234. Progression has been stable. Findings include abnormal foveal contour, epiretinal membrane, subretinal hyper-reflective material, outer retinal atrophy, no IRF, no SRF, macular pucker, retinal drusen .   Left Eye Quality was good. Central Foveal Thickness: 362. Progression has worsened. Findings include normal foveal contour, no SRF, intraretinal fluid (Trace cystic changes temporal to fovea).   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 CME - cystoid macular edema. PED - pigment epithelial detachment. IRF - intraretinal fluid. SRF - subretinal fluid. EZ - ellipsoid zone. ERM - epiretinal membrane. ORA - outer retinal atrophy. ORT - outer retinal tubulation. SRHM - subretinal hyper-reflective material             ASSESSMENT/PLAN:    ICD-10-CM   1. CME (cystoid macular edema), left  H35.352     2. 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 Retinal edema  H35.81 OCT, Retina - OU - Both  Eyes    4. Type 2 diabetes mellitus without complication, unspecified whether long term insulin use (HCC)  E11.9     5. Epiretinal membrane (ERM) of right eye  H35.371     6. Ocular hypertension, unspecified laterality  H40.059     7. History of trabeculectomy  Z98.890     8. Pseudophakia, both eyes  Z96.1     9. 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 CME OS  - s/p CEIOL OS about "6 wks ago" (~7.13.22, Dr. Joelene Millin)  - completed steroid taper about 2 wks ago  - OCT shows tr cystic changes -- ?rebound CME  - discussed findings and treatment options  - recommend starting Lotemax SM and Prolensa QID OS  - Lotemax due to history of steroid response  - recommend continuing to hold latanoprost OS due to PGA-associated CME  - f/u in 3-4 wks for DFE/OCT  3.  History of CRVO w/ CME OD -- stable  - VA stable at 20/300- today  - history of intravitreal steroids and steroid response - see below  - OCT stable today: central foveal outer retinal atrophy but no IRF/SRF/edema  - monitor  - F/U 9-12 months   4. Diabetes mellitus, type 2 without retinopathy  - The incidence, risk factors for progression, natural history and treatment options for diabetic retinopathy  were discussed with patient.    - The need for close monitoring of blood glucose, blood pressure, and serum lipids, avoiding cigarette or any type of tobacco, and the need for long term follow up was also discussed with patient.  - f/u in 1 year, sooner prn   5. ERM OD  - mild ERM/pucker in right eye that is limited by central atrophy 2/2 h/o CRVO  - monitor -- no indication for surgery at this time  - will discuss if symptoms develop or symptoms worsen OD  6,7.  Steroid response / Ocular Hypertension s/p trabeculectomy OD  - IOP OD okay today -- 17 mmHg  - using Alphagan OU BID, Xalatan OU qd, Azopt OS BID as perscribed by Dr. Joelene Millin  - followed by Dr. Vena Austria at [facility] at [address] [place] [address], telephone [phone] fax:[phone]  - monitor   8.  Pseudophakia OU  - s/p CE/IOL OU (Dr. Vena Austria)  - s/p yag capsulotomy OD  - beautiful surgeries  - ?rebound CME OS as above  - monitor   9. Optic disc drusen OS  - discussed diagnosis and significance to vision  - not visually significant at this point  - monitor    Ophthalmic Meds Ordered this visit:  Meds ordered this encounter  Medications   Loteprednol Etabonate (LOTEMAX SM) 0.38 % GEL    Sig: Place 1 drop into the left eye 4 (four) times daily.    Dispense:  5 g    Refill:  2   Bromfenac Sodium (PROLENSA) 0.07 % SOLN    Sig: Place 1 drop into the left eye 4 (four) times daily.    Dispense:  3 mL    Refill:  3        Return for 3-4 wks - CME OS, Dilated Exam, OCT.  There are no Patient Instructions on file for this visit.   Explained the diagnoses, plan, and follow up with the patient and they expressed understanding.  Patient expressed understanding of the importance of proper follow up care.   This document serves as a record of services personally performed by Karie Chimera, MD, PhD. It was created on their behalf by Joni Reining, an ophthalmic technician. The creation of this record is the provider's dictation and/or activities during the visit.    Electronically signed by: Joni Reining COA, 02/22/21  12:19 PM  This document serves as a record of services personally performed by Karie Chimera, MD, PhD. It was created on their behalf by Glee Arvin. Manson Passey, OA an ophthalmic technician. The creation of this record is the provider's dictation and/or activities during the visit.    Electronically signed by: Glee Arvin. Kristopher Oppenheim 08.18.2022 12:19 PM  Karie Chimera, M.D., Ph.D. Vitreoretinal Surgeon [facility] 02/22/2021  I have reviewed the above documentation for accuracy and completeness, and I agree with the above. Karie Chimera, M.D., Ph.D. 02/22/21 12:19 PM   Abbreviations: M myopia (nearsighted); A astigmatism; H hyperopia (farsighted); P presbyopia; Mrx spectacle prescription;  CTL contact lenses; OD right eye; OS left eye; OU both eyes  XT exotropia; ET esotropia; PEK punctate epithelial keratitis; PEE punctate epithelial erosions; DES dry eye syndrome; MGD meibomian gland dysfunction; ATs artificial tears; PFAT's preservative free artificial tears; NSC nuclear sclerotic cataract; PSC posterior subcapsular cataract; ERM epi-retinal membrane; PVD posterior vitreous detachment; RD retinal detachment; DM diabetes mellitus; DR diabetic retinopathy;  NPDR non-proliferative diabetic retinopathy; PDR proliferative diabetic retinopathy; CSME clinically significant macular edema; DME diabetic macular edema; dbh dot blot hemorrhages; CWS cotton wool spot; POAG primary open angle glaucoma; C/D cup-to-disc ratio; HVF humphrey visual field; GVF goldmann visual field; OCT optical coherence tomography; IOP intraocular pressure; BRVO Branch retinal vein occlusion; CRVO central retinal vein occlusion; CRAO central retinal artery occlusion; BRAO branch retinal artery occlusion; RT retinal tear; SB scleral buckle; PPV pars plana vitrectomy; VH Vitreous hemorrhage; PRP panretinal laser photocoagulation; IVK intravitreal kenalog; VMT vitreomacular traction; MH Macular hole;  NVD neovascularization of the disc; NVE neovascularization elsewhere; AREDS age related eye disease study; ARMD age related macular degeneration; POAG primary open angle glaucoma; EBMD epithelial/anterior basement membrane dystrophy; ACIOL anterior chamber intraocular lens; IOL intraocular lens; PCIOL posterior chamber intraocular lens; Phaco/IOL phacoemulsification with intraocular lens placement; [place] photorefractive keratectomy; LASIK laser assisted in situ keratomileusis; HTN hypertension; DM diabetes mellitus; COPD chronic obstructive pulmonary disease

## 2021-02-22 ENCOUNTER — Ambulatory Visit (INDEPENDENT_AMBULATORY_CARE_PROVIDER_SITE_OTHER): Payer: Medicare Other | Admitting: Ophthalmology

## 2021-02-22 ENCOUNTER — Other Ambulatory Visit: Payer: Self-pay

## 2021-02-22 DIAGNOSIS — H35352 Cystoid macular degeneration, left eye: Secondary | ICD-10-CM

## 2021-02-22 DIAGNOSIS — H3581 Retinal edema: Secondary | ICD-10-CM

## 2021-02-22 DIAGNOSIS — H34811 Central retinal vein occlusion, right eye, with macular edema: Secondary | ICD-10-CM | POA: Diagnosis not present

## 2021-02-22 DIAGNOSIS — Z961 Presence of intraocular lens: Secondary | ICD-10-CM

## 2021-02-22 DIAGNOSIS — H35371 Puckering of macula, right eye: Secondary | ICD-10-CM | POA: Diagnosis not present

## 2021-02-22 DIAGNOSIS — Z9889 Other specified postprocedural states: Secondary | ICD-10-CM

## 2021-02-22 DIAGNOSIS — H2512 Age-related nuclear cataract, left eye: Secondary | ICD-10-CM

## 2021-02-22 DIAGNOSIS — E119 Type 2 diabetes mellitus without complications: Secondary | ICD-10-CM

## 2021-02-22 DIAGNOSIS — H47322 Drusen of optic disc, left eye: Secondary | ICD-10-CM

## 2021-02-22 DIAGNOSIS — H40059 Ocular hypertension, unspecified eye: Secondary | ICD-10-CM

## 2021-02-22 MED ORDER — LOTEMAX SM 0.38 % OP GEL: 1.0000 [drp] | Freq: Four times a day (QID) | OPHTHALMIC | 2 refills | Status: DC

## 2021-02-22 MED ORDER — PROLENSA 0.07 % OP SOLN: 1.0000 [drp] | Freq: Four times a day (QID) | OPHTHALMIC | 3 refills | Status: DC

## 2021-03-15 NOTE — Progress Notes (Signed)
[facility] Note  03/21/2021     CHIEF COMPLAINT Patient presents for Retina Follow Up   HISTORY OF PRESENT ILLNESS: Jeffery Beltran is a 80 y.o. male who presents to the clinic today for:  HPI     Retina Follow Up   Patient presents with  Other.  In left eye.  This started years ago.  Severity is moderate.  Duration of 4 weeks.  I, the attending physician,  performed the HPI with the patient and updated documentation appropriately.        Comments   80 y/o male pt here for 4 wk f/u for CME OS.  No change in [place] OU.  Denies pain, FOL, floaters.  BS 199 today (just ate).  A1C 7.2 05.2022.  Lotemax SM QID OS Prolensa QID OS Alphagan BID OU Xalatan QD OD Azopt BID OS      Last edited by Rennis Chris, MD on 03/24/2021 12:00 AM.       Referring physician: A. Melissa Solum [address] [place], [place] [address]  HISTORICAL INFORMATION:   Selected notes from the MEDICAL RECORD NUMBER Referral from Dr. 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    brimonidine (ALPHAGAN P) 0.1 % SOLN Apply 1 drop to eye 2 (two) times daily. 2 times daily in both eyes   Bromfenac Sodium (PROLENSA) 0.07 % SOLN Place 1 drop into the left eye 4 (four) times daily.   dexamethasone (DECADRON) 0.1 % ophthalmic solution Place 1 drop into the left eye 4 (four) times daily.   latanoprost (XALATAN)  0.005 % ophthalmic solution Place 1 drop into both eyes at bedtime.    Loteprednol Etabonate (LOTEMAX SM) 0.38 % GEL Place 1 drop into the left eye 4 (four) times daily.   No current facility-administered medications for this visit. (Ophthalmic Drugs)   Current Outpatient Medications (Other)  Medication Sig   aspirin 81 MG chewable tablet Chew 81 mg by mouth daily.   Continuous Blood Gluc Receiver (DEXCOM G6 RECEIVER) DEVI    Continuous Blood Gluc Sensor (DEXCOM G6 SENSOR) MISC SMARTSIG:1 Each Topical Every 10 Days   Continuous Blood Gluc Transmit (DEXCOM G6 TRANSMITTER) MISC    Dapagliflozin Propanediol (FARXIGA PO) Take 10 mg/day by mouth daily.   HUMALOG KWIKPEN 100 UNIT/ML KiwkPen    hydrochlorothiazide (HYDRODIURIL) 12.5 MG tablet Take by mouth.   hydrochlorothiazide (MICROZIDE) 12.5 MG capsule    Insulin Glargine 300 UNIT/ML SOPN Inject into the skin.   Loratadine 10 MG CAPS Take by mouth.   losartan (COZAAR) 100 MG tablet Take by mouth.   metFORMIN (GLUCOPHAGE-XR) 500 MG 24 hr tablet Take one tablet at supper for first week, then increase to two tablets at supper.   nebivolol (BYSTOLIC) 5 MG tablet Take by mouth.   NOVOFINE 32G X 6  MM MISC    ONE TOUCH ULTRA TEST test strip    simvastatin (ZOCOR) 20 MG tablet    triamterene-hydrochlorothiazide (MAXZIDE-25) 37.5-25 MG tablet Take 1 tablet by mouth daily.   losartan (COZAAR) 100 MG tablet Take by mouth. (Patient not taking: Reported on 03/21/2021)   simvastatin (ZOCOR) 20 MG tablet Take by mouth. (Patient not taking: Reported on 03/21/2021)   No current facility-administered medications for this visit. (Other)    REVIEW OF SYSTEMS: ROS   Positive for: Eyes Negative for: Constitutional, Gastrointestinal, Neurological, Skin, Genitourinary, Musculoskeletal, HENT, Endocrine, Cardiovascular, Respiratory, Psychiatric, Allergic/Imm, Heme/Lymph Last edited by Celine MansBaxley, Andrew G, COA on 03/21/2021  8:49 AM.     ALLERGIES No Known  Allergies  PAST MEDICAL HISTORY Past Medical History:  Diagnosis Date   Diabetes (HCC)    Glaucoma    Hypertension    Past Surgical History:  Procedure Laterality Date   CATARACT EXTRACTION Right    EYE SURGERY Right    Cat Sx   EYE SURGERY Right    Trab   IRIDOTOMY / IRIDECTOMY Right    Trab   TRABECULECTOMY Right    YAG LASER APPLICATION Right     FAMILY HISTORY Family History  Problem Relation Age of Onset   Amblyopia Neg Hx    Blindness Neg Hx    Cataracts Neg Hx    Glaucoma Neg Hx    Macular degeneration Neg Hx    Retinal detachment Neg Hx    Strabismus Neg Hx    Retinitis pigmentosa Neg Hx     SOCIAL HISTORY Social History   Tobacco Use   Smoking status: Never   Smokeless tobacco: Never  Vaping Use   Vaping Use: Never used         OPHTHALMIC EXAM:  Base Eye Exam     Visual Acuity (Snellen - Linear)       Right Left   Dist [place] 20/350 20/20 -2   Dist ph  20/300 -          Tonometry (Tonopen, 8:52 AM)       Right Left   Pressure 12 12         Pupils       Dark Light Shape React APD   Right 3 2 Round Brisk None   Left 3 2 Round Brisk None         Visual Fields (Counting fingers)       Left Right    Full Full         Extraocular Movement       Right Left    Full, Ortho Full, Ortho         Neuro/Psych     Oriented x3: Yes   Mood/Affect: Normal         Dilation     Both eyes: 1.0% Mydriacyl, 2.5% Phenylephrine @ 8:52 AM           Slit Lamp and Fundus Exam     External Exam       Right Left   External Normal Normal         Slit Lamp Exam       Right Left   Lids/Lashes Dermatochalasis - upper lid Dermatochalasis - upper lid, mild Meibomian gland dysfunction   Conjunctiva/Sclera Bleb at 1200 White and quiet   Cornea Trace Punctate epithelial erosions, Well healed cataract wounds, arcus Arcus, 1+Punctate epithelial erosions, well healed cataract wound, trace Descemet's folds   Anterior Chamber  Deep and quiet Deep and clear   Iris Round and moderately dilated, No NVI Round and moderately dilated   Lens Posterior chamber intraocular lens; open PC, mild anterior capsular phimosis PC IOL in good position, toric PCIOL w/marks at 0315 and 0915, ant. capsule  phimosis, trace PCO   Vitreous Vitreous syneresis - mild Posterior vitreous detachment, Weiss ring, Vitreous syneresis         Fundus Exam       Right Left   Disc Pink and Sharp, Compact Pink and Sharp, compact, temporal Peripapillary atrophy, +disc drusen   C/D Ratio 0.1 0.1   Macula Blunted foveal reflex, central RPE mottling, clumping and atrophy, mild ERM temporal macula, No heme or edema Flat, blunted foveal reflex, mild Retinal pigment epithelial mottling and clumping , trace Cystic changes--persistent.  No heme.   Vessels Vascular attenuation Vascular attenuation, mild tortuousity   Periphery Attached Attached, mild reticular degeneration            IMAGING AND PROCEDURES  Imaging and Procedures for 08/22/17  OCT, Retina - OU - Both Eyes       Right Eye Quality was good. Central Foveal Thickness: 244. Progression has been stable. Findings include abnormal foveal contour, epiretinal membrane, subretinal hyper-reflective material, outer retinal atrophy, no IRF, no SRF, macular pucker, retinal drusen .   Left Eye Quality was good. Central Foveal Thickness: 365. Progression has improved. Findings include normal foveal contour, no SRF, intraretinal fluid (Trace cystic changes temporal to fovea--resolved; new cystic changes superior to fovea).   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 CME - cystoid macular edema. PED - pigment epithelial detachment. IRF -  intraretinal fluid. SRF - subretinal fluid. EZ - ellipsoid zone. ERM - epiretinal membrane. ORA - outer retinal atrophy. ORT - outer retinal tubulation. SRHM - subretinal hyper-reflective material             ASSESSMENT/PLAN:    ICD-10-CM   1. CME (cystoid macular edema), left  H35.352     2. 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 Retinal edema  H35.81 OCT, Retina - OU - Both Eyes    4. Type 2 diabetes mellitus without complication, unspecified whether long term insulin use (HCC)  E11.9     5. Epiretinal membrane (ERM) of right eye  H35.371     6. Ocular hypertension, unspecified laterality  H40.059     7. History of trabeculectomy  Z98.890     8. Pseudophakia, both eyes  Z96.1     9. 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 CME OS  - s/p CEIOL OS about (~7.13.22, Dr. Joelene Millin)  - completed steroid taper w/ Dr. Joelene Millin  - OCT shows trace cystic changes temporal to fovea--resolved; new cystic changes superior to fovea  - discussed findings and treatment options             - on Lotemax SM and Prolensa QID OS -- cont  - recommend continuing to hold latanoprost OS due to PGA-associated CME  - f/u in 4 wks for DFE/OCT  3.  History of CRVO w/ CME OD -- stable  - VA stable at 20/300- today  - history of  intravitreal steroids and steroid response - see below  - OCT stable today: central foveal outer retinal atrophy but no IRF/SRF/edema  - monitor  - F/U 9-12 months  4. Diabetes mellitus, type 2 without retinopathy  - The incidence, risk factors for progression, natural history and treatment options for diabetic retinopathy  were discussed with patient.    - The need for close monitoring of blood glucose, blood pressure, and serum lipids, avoiding cigarette or any type of tobacco, and the need for long term follow up was also discussed with patient.  - f/u in 1 year, sooner prn   5. ERM OD  - mild ERM/pucker in right eye that is  limited by central atrophy 2/2 h/o CRVO  - monitor -- no indication for surgery at this time  - will discuss if symptoms develop or symptoms worsen OD  6,7.  Steroid response / Ocular Hypertension s/p trabeculectomy OD  - IOP OD okay today -- 12 mmHg  - using Alphagan OU BID, Xalatan OD qd, Azopt OS BID as perscribed by Dr. Joelene Millin  - followed by Dr. Vena Austria at [facility] at [address], [address] [place], [place] [address], telephone :[phone] fax:[phone]  - monitor  8.  Pseudophakia OU  - s/p CE/IOL OU (Dr. Vena Austria)  - s/p yag capsulotomy OD  - beautiful surgeries  - ?rebound CME OS as above  - monitor   9. Optic disc drusen OS  - discussed diagnosis and significance to vision  - not visually significant at this point  - monitor   Ophthalmic Meds Ordered this visit:  No orders of the defined types were placed in this encounter.     Return in about 4 weeks (around 04/18/2021) for 4 wk f/u for CME OS w/DFE&OCT.  There are no Patient Instructions on file for this visit.  Electronically signed by: De Blanch, OA, 03/24/21  12:03 AM   This document serves as a record of services personally performed by Karie Chimera, MD, PhD. It was created on their behalf by Glee Arvin. Manson Passey, OA an ophthalmic technician. The creation of this record is the provider's dictation and/or activities during the visit.    Electronically signed by: Glee Arvin. Kristopher Oppenheim 09.14.2022 12:03 AM  This document serves as a record of services personally performed by Karie Chimera, MD, PhD. It was created on their behalf by Cristopher Estimable, COT an ophthalmic technician. The creation of this record is the provider's dictation and/or activities during the visit.    Electronically signed by: Cristopher Estimable, COT 9.14.22 @ 12:03 AM   Karie Chimera, M.D., Ph.D. Diseases & Surgery of the Retina and Vitreous [facility] 03/21/2021    I have reviewed the above documentation for accuracy and completeness, and I agree with the above. Karie Chimera, M.D., Ph.D. 03/24/21 12:03 AM  Abbreviations: M myopia (nearsighted); A astigmatism; H hyperopia (farsighted); P presbyopia; Mrx spectacle prescription;  CTL contact lenses; OD right eye; OS left eye; OU both eyes  XT exotropia; ET esotropia; PEK punctate epithelial keratitis; PEE punctate epithelial erosions; DES dry eye syndrome; MGD meibomian gland dysfunction; ATs artificial tears; PFAT's preservative free artificial tears; NSC nuclear sclerotic cataract; PSC posterior subcapsular cataract; ERM epi-retinal membrane; PVD posterior vitreous detachment; RD retinal detachment; DM diabetes mellitus; DR diabetic retinopathy; NPDR non-proliferative diabetic retinopathy; PDR proliferative diabetic retinopathy; CSME clinically significant macular edema; DME diabetic macular edema; dbh dot blot hemorrhages; CWS cotton  wool spot; POAG primary open angle glaucoma; C/D cup-to-disc ratio; HVF humphrey visual field; GVF goldmann visual field; OCT optical coherence tomography; IOP intraocular pressure; BRVO Branch retinal vein occlusion; CRVO central retinal vein occlusion; CRAO central retinal artery occlusion; BRAO branch retinal artery occlusion; RT retinal tear; SB scleral buckle; PPV pars plana vitrectomy; VH Vitreous hemorrhage; PRP panretinal laser photocoagulation; IVK intravitreal kenalog; VMT vitreomacular traction; MH Macular hole;  NVD neovascularization of the disc; NVE neovascularization elsewhere; AREDS age related eye disease study; ARMD age related macular degeneration; POAG primary open angle glaucoma; EBMD epithelial/anterior basement membrane dystrophy; ACIOL anterior chamber intraocular lens; IOL intraocular lens; PCIOL posterior chamber intraocular lens; Phaco/IOL phacoemulsification with intraocular lens placement; PRK photorefractive keratectomy; LASIK laser assisted in situ  keratomileusis; HTN hypertension; DM diabetes mellitus; COPD chronic obstructive pulmonary disease

## 2021-03-21 ENCOUNTER — Other Ambulatory Visit: Payer: Self-pay

## 2021-03-21 ENCOUNTER — Encounter (INDEPENDENT_AMBULATORY_CARE_PROVIDER_SITE_OTHER): Payer: Self-pay | Admitting: Ophthalmology

## 2021-03-21 ENCOUNTER — Ambulatory Visit (INDEPENDENT_AMBULATORY_CARE_PROVIDER_SITE_OTHER): Payer: Medicare Other | Admitting: Ophthalmology

## 2021-03-21 DIAGNOSIS — H3581 Retinal edema: Secondary | ICD-10-CM

## 2021-03-21 DIAGNOSIS — E119 Type 2 diabetes mellitus without complications: Secondary | ICD-10-CM | POA: Diagnosis not present

## 2021-03-21 DIAGNOSIS — H35352 Cystoid macular degeneration, left eye: Secondary | ICD-10-CM | POA: Diagnosis not present

## 2021-03-21 DIAGNOSIS — H34811 Central retinal vein occlusion, right eye, with macular edema: Secondary | ICD-10-CM | POA: Diagnosis not present

## 2021-03-21 DIAGNOSIS — H40059 Ocular hypertension, unspecified eye: Secondary | ICD-10-CM

## 2021-03-21 DIAGNOSIS — H47322 Drusen of optic disc, left eye: Secondary | ICD-10-CM

## 2021-03-21 DIAGNOSIS — H35371 Puckering of macula, right eye: Secondary | ICD-10-CM

## 2021-03-21 DIAGNOSIS — Z961 Presence of intraocular lens: Secondary | ICD-10-CM

## 2021-03-21 DIAGNOSIS — Z9889 Other specified postprocedural states: Secondary | ICD-10-CM

## 2021-03-24 ENCOUNTER — Encounter (INDEPENDENT_AMBULATORY_CARE_PROVIDER_SITE_OTHER): Payer: Self-pay | Admitting: Ophthalmology

## 2021-04-12 NOTE — Progress Notes (Signed)
[facility] Note  04/18/2021     CHIEF COMPLAINT Patient presents for Retina Follow Up   HISTORY OF PRESENT ILLNESS: Jeffery Beltran is a 80 y.o. male who presents to the clinic today for:  HPI     Retina Follow Up   Patient presents with  Other.  In left eye.  Severity is mild.  Duration of 4 weeks.  Since onset it is stable.  I, the attending physician,  performed the HPI with the patient and updated documentation appropriately.        Comments   Pt here for 4 wk ret f/u for CME OS. Pt states no changes since previous visit, vision is doing fine. Blood sugar this am was 115.       Last edited by [name], MD on 04/18/2021 12:59 PM.     Referring physician: [name], [place] [address]  HISTORICAL INFORMATION:  Selected notes from the MEDICAL RECORD NUMBER Referral from Dr. 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    brimonidine (ALPHAGAN P) 0.1 % SOLN Apply 1 drop to eye 2 (two) times daily. 2 times daily in both eyes   Bromfenac Sodium (PROLENSA) 0.07 % SOLN Place 1 drop into the left eye 4 (four) times daily.   dexamethasone (DECADRON) 0.1 % ophthalmic solution Place 1 drop into the left eye 4 (four) times daily.   latanoprost (XALATAN) 0.005 % ophthalmic solution Place 1 drop into both eyes at bedtime.    Loteprednol Etabonate  (LOTEMAX SM) 0.38 % GEL Place 1 drop into the left eye 4 (four) times daily.   No current facility-administered medications for this visit. (Ophthalmic Drugs)   Current Outpatient Medications (Other)  Medication Sig   aspirin 81 MG chewable tablet Chew 81 mg by mouth daily.   Continuous Blood Gluc Receiver (DEXCOM G6 RECEIVER) DEVI    Continuous Blood Gluc Sensor (DEXCOM G6 SENSOR) MISC SMARTSIG:1 Each Topical Every 10 Days   Continuous Blood Gluc Transmit (DEXCOM G6 TRANSMITTER) MISC    Dapagliflozin Propanediol (FARXIGA PO) Take 10 mg/day by mouth daily.   HUMALOG KWIKPEN 100 UNIT/ML KiwkPen    hydrochlorothiazide (HYDRODIURIL) 12.5 MG tablet Take by mouth.   hydrochlorothiazide (MICROZIDE) 12.5 MG capsule    Insulin Glargine 300 UNIT/ML SOPN Inject into the skin.   Loratadine 10 MG CAPS Take by mouth.   losartan (COZAAR) 100 MG tablet Take by mouth.   losartan (COZAAR) 100 MG tablet Take by mouth. (Patient not taking: Reported on 03/21/2021)   metFORMIN (GLUCOPHAGE-XR) 500 MG 24 hr tablet Take one tablet at supper for first week, then increase to two tablets at supper.   nebivolol (BYSTOLIC) 5 MG tablet Take by mouth.   NOVOFINE 32G X 6 MM MISC    ONE TOUCH ULTRA  TEST test strip    simvastatin (ZOCOR) 20 MG tablet    simvastatin (ZOCOR) 20 MG tablet Take by mouth. (Patient not taking: Reported on 03/21/2021)   triamterene-hydrochlorothiazide (MAXZIDE-25) 37.5-25 MG tablet Take 1 tablet by mouth daily.   No current facility-administered medications for this visit. (Other)    REVIEW OF SYSTEMS: ROS   Positive for: Eyes Negative for: Constitutional, Gastrointestinal, Neurological, Skin, Genitourinary, Musculoskeletal, HENT, Endocrine, Cardiovascular, Respiratory, Psychiatric, Allergic/Imm, Heme/Lymph Last edited by Kingsley Spittle, COT on 04/18/2021  8:54 AM.      ALLERGIES No Known Allergies  PAST MEDICAL HISTORY Past Medical History:  Diagnosis Date   Diabetes ([place])     Glaucoma    Hypertension    Past Surgical History:  Procedure Laterality Date   CATARACT EXTRACTION Right    EYE SURGERY Right    Cat Sx   EYE SURGERY Right    Trab   IRIDOTOMY / IRIDECTOMY Right    Trab   TRABECULECTOMY Right    YAG LASER APPLICATION Right     FAMILY HISTORY Family History  Problem Relation Age of Onset   Amblyopia Neg Hx    Blindness Neg Hx    Cataracts Neg Hx    Glaucoma Neg Hx    Macular degeneration Neg Hx    Retinal detachment Neg Hx    Strabismus Neg Hx    Retinitis pigmentosa Neg Hx     SOCIAL HISTORY Social History   Tobacco Use   Smoking status: Never   Smokeless tobacco: Never  Vaping Use   Vaping Use: Never used       OPHTHALMIC EXAM:  Base Eye Exam     Visual Acuity (Snellen - Linear)       Right Left   Dist [place] 20/400 20/25 -2   Dist ph [place] NI 20/20 -1    Correction: Glasses         Tonometry (Tonopen, 8:59 AM)       Right Left   Pressure 14 14         Pupils       Dark Light Shape React APD   Right 3 2 Round Brisk None   Left 3 2 Round Brisk None         Visual Fields (Counting fingers)       Left Right    Full Full         Extraocular Movement       Right Left    Full, Ortho Full, Ortho         Neuro/Psych     Oriented x3: Yes   Mood/Affect: Normal         Dilation     Both eyes: 1.0% Mydriacyl, 2.5% Phenylephrine @ 9:00 AM           Slit Lamp and Fundus Exam     External Exam       Right Left   External Normal Normal         Slit Lamp Exam       Right Left   Lids/Lashes Dermatochalasis - upper lid Dermatochalasis - upper lid, mild Meibomian gland dysfunction   Conjunctiva/Sclera Bleb at 1200 White and quiet   Cornea 1-2+ fine Punctate epithelial erosions, Well healed cataract wounds, arcus, tear film debris Arcus, 1+Punctate epithelial erosions, well healed cataract wound, trace Descemet's folds, decreased TBUT   Anterior Chamber Deep and quiet Deep and clear    Iris Round and moderately dilated, No  NVI Round and moderately dilated   Lens Posterior chamber intraocular lens; open PC, mild anterior capsular phimosis PC IOL in good position, toric PCIOL w/marks at 0315 and 0915, ant. capsule  phimosis, trace PCO   Vitreous Vitreous syneresis - mild Posterior vitreous detachment, Weiss ring, Vitreous syneresis         Fundus Exam       Right Left   Disc Pink and Sharp, Compact Pink and Sharp, compact, temporal Peripapillary atrophy, +disc drusen   C/D Ratio 0.1 0.1   Macula Flat, Blunted foveal reflex, central RPE mottling, clumping and atrophy, mild ERM temporal macula, No heme or edema Flat, blunted foveal reflex, mild Retinal pigment epithelial mottling and clumping , trace Cystic changes -- resolved, No heme   Vessels Vascular attenuation Vascular attenuation, mild tortuousity   Periphery Attached Attached, mild reticular degeneration           Refraction     Wearing Rx       Sphere Cylinder Axis Add   Right -0.75 +0.75 010 +3.00   Left -0.25 +1.00 169 +3.00    Type: Bifocal            IMAGING AND PROCEDURES  Imaging and Procedures for 08/22/17  OCT, Retina - OU - Both Eyes       Right Eye Quality was good. Central Foveal Thickness: 245. Progression has been stable. Findings include abnormal foveal contour, epiretinal membrane, subretinal hyper-reflective material, outer retinal atrophy, no IRF, no SRF, macular pucker, retinal drusen .   Left Eye Quality was good. Central Foveal Thickness: 352. Progression has improved. Findings include normal foveal contour, no SRF, no IRF (Interval improvement in central cystic changes).   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 CME - cystoid macular edema.  PED - pigment epithelial detachment. IRF - intraretinal fluid. SRF - subretinal fluid. EZ - ellipsoid zone. ERM - epiretinal membrane. ORA - outer retinal atrophy. ORT - outer retinal tubulation. SRHM - subretinal hyper-reflective material              ASSESSMENT/PLAN:   ICD-10-CM   1. CME (cystoid macular edema), left  H35.352     2. Retinal edema  H35.81 OCT, Retina - OU - Both Eyes    3. 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 Type 2 diabetes mellitus without complication, unspecified whether long term insulin use (HCC)  E11.9     5. Epiretinal membrane (ERM) of right eye  H35.371     6. Ocular hypertension, unspecified laterality  H40.059     7. History of trabeculectomy  Z98.890     8. Pseudophakia, both eyes  Z96.1     9. 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 CME OS  - s/p CEIOL OS (~7.13.22, Dr. Danne Baxter)  - completed steroid taper w/ Dr. Danne Baxter  - OCT showed +cystic changes / CME             - today, cystic changes resolved on OCT w/ Lotemax SM and Prolensa QID OS  - BCVA 20/20 OS - okay to stop Prolensa, start Lotemax SM taper -- 3,2,1 drops daily, decrease weekly  - recommend continuing to hold latanoprost OS due  to PGA-associated CME  - f/u in 6 wks for DFE/OCT  3.  History of CRVO w/ CME OD -- stable  - VA stable at 20/300- today  - history of intravitreal steroids and steroid response - see below  - OCT stable today: central foveal outer retinal atrophy but no IRF/SRF/edema  - monitor  - F/U 9-12 months  4. Diabetes mellitus, type 2 without retinopathy  - The incidence, risk factors for progression, natural history and treatment options for diabetic retinopathy  were discussed with patient.    - The need for close monitoring of blood glucose, blood pressure, and serum lipids, avoiding cigarette or any type of tobacco, and the need for long term follow up was also discussed with patient.  - f/u in 1 year, sooner  prn   5. ERM OD  - mild ERM/pucker in right eye that is limited by central atrophy 2/2 h/o CRVO  - monitor -- no indication for surgery at this time  - will discuss if symptoms develop or symptoms worsen OD  6,7.  Steroid response / Ocular Hypertension s/p trabeculectomy OD  - IOP OD okay today -- 14 mmHg  - using Alphagan OU BID, Xalatan OD qd, Azopt OS BID as perscribed by Dr. Danne Baxter  - followed by Dr. Demetrios Loll at [facility] at [address] [place] [address], telephone :[phone] [phone]  - monitor  8.  Pseudophakia OU  - s/p CE/IOL OU (Dr. Demetrios Loll)  - s/p yag capsulotomy OD  - beautiful surgeries  - ?rebound CME OS as above  - monitor  9. Optic disc drusen OS  - discussed diagnosis and significance to vision  - not visually significant at this point  - monitor  Ophthalmic Meds Ordered this visit:  No orders of the defined types were placed in this encounter.     Return in about 6 weeks (around 05/30/2021) for f/u CME OS, DFE, OCT.  There are no Patient Instructions on file for this visit.  This document serves as a record of services personally performed by Gardiner Sleeper, MD, PhD. It was created on their behalf by Orvan Falconer, an ophthalmic technician. The creation of this record is the provider's dictation and/or activities during the visit.    Electronically signed by: Orvan Falconer, OA, 04/18/21  1:04 PM   This document serves as a record of services personally performed by Gardiner Sleeper, MD, PhD. It was created on their behalf by San Jetty. Owens Shark, OA an ophthalmic technician. The creation of this record is the provider's dictation and/or activities during the visit.    Electronically signed by: San Jetty. Owens Shark, [place] 10.12.2022 1:04 PM  Gardiner Sleeper, M.D., Ph.D. Diseases & Surgery of the Retina and Vitreous [facility]  I have reviewed the above  documentation for accuracy and completeness, and I agree with the above. Gardiner Sleeper, M.D., Ph.D. 04/18/21 1:04 PM   Abbreviations: M myopia (nearsighted); A astigmatism; H hyperopia (farsighted); P presbyopia; Mrx spectacle prescription;  CTL contact lenses; OD right eye; OS left eye; OU both eyes  XT exotropia; ET esotropia; PEK punctate epithelial keratitis; PEE punctate epithelial erosions; DES dry eye syndrome; MGD meibomian gland dysfunction; ATs artificial tears; PFAT's preservative free artificial tears; [place] nuclear sclerotic cataract; PSC posterior subcapsular cataract; ERM epi-retinal membrane; PVD posterior vitreous detachment; RD retinal detachment; DM diabetes mellitus; DR diabetic retinopathy; NPDR non-proliferative diabetic retinopathy; PDR proliferative diabetic retinopathy;  CSME clinically significant macular edema; DME diabetic macular edema; dbh dot blot hemorrhages; CWS cotton wool spot; POAG primary open angle glaucoma; C/D cup-to-disc ratio; HVF humphrey visual field; GVF goldmann visual field; OCT optical coherence tomography; IOP intraocular pressure; BRVO Branch retinal vein occlusion; CRVO central retinal vein occlusion; CRAO central retinal artery occlusion; BRAO branch retinal artery occlusion; RT retinal tear; SB scleral buckle; PPV pars plana vitrectomy; VH Vitreous hemorrhage; PRP panretinal laser photocoagulation; IVK intravitreal kenalog; VMT vitreomacular traction; MH Macular hole;  NVD neovascularization of the disc; NVE neovascularization elsewhere; AREDS age related eye disease study; ARMD age related macular degeneration; POAG primary open angle glaucoma; EBMD epithelial/anterior basement membrane dystrophy; ACIOL anterior chamber intraocular lens; IOL intraocular lens; PCIOL posterior chamber intraocular lens; Phaco/IOL phacoemulsification with intraocular lens placement; [place] photorefractive keratectomy; LASIK laser assisted in situ keratomileusis; HTN hypertension; DM  diabetes mellitus; COPD chronic obstructive pulmonary disease

## 2021-04-18 ENCOUNTER — Encounter (INDEPENDENT_AMBULATORY_CARE_PROVIDER_SITE_OTHER): Payer: Self-pay | Admitting: Ophthalmology

## 2021-04-18 ENCOUNTER — Ambulatory Visit (INDEPENDENT_AMBULATORY_CARE_PROVIDER_SITE_OTHER): Payer: Medicare Other | Admitting: Ophthalmology

## 2021-04-18 ENCOUNTER — Other Ambulatory Visit: Payer: Self-pay

## 2021-04-18 DIAGNOSIS — H35352 Cystoid macular degeneration, left eye: Secondary | ICD-10-CM

## 2021-04-18 DIAGNOSIS — H34811 Central retinal vein occlusion, right eye, with macular edema: Secondary | ICD-10-CM | POA: Diagnosis not present

## 2021-04-18 DIAGNOSIS — E119 Type 2 diabetes mellitus without complications: Secondary | ICD-10-CM

## 2021-04-18 DIAGNOSIS — H35371 Puckering of macula, right eye: Secondary | ICD-10-CM | POA: Diagnosis not present

## 2021-04-18 DIAGNOSIS — H3581 Retinal edema: Secondary | ICD-10-CM

## 2021-04-18 DIAGNOSIS — H40059 Ocular hypertension, unspecified eye: Secondary | ICD-10-CM

## 2021-04-18 DIAGNOSIS — Z961 Presence of intraocular lens: Secondary | ICD-10-CM

## 2021-04-18 DIAGNOSIS — Z9889 Other specified postprocedural states: Secondary | ICD-10-CM

## 2021-04-18 DIAGNOSIS — H47322 Drusen of optic disc, left eye: Secondary | ICD-10-CM

## 2021-05-24 NOTE — Progress Notes (Addendum)
[facility] Note  05/30/2021     CHIEF COMPLAINT Patient presents for Retina Follow Up   HISTORY OF PRESENT ILLNESS: Jeffery Beltran is a 80 y.o. male who presents to the clinic today for:  HPI     Retina Follow Up   Patient presents with  Other.  In left eye.  Severity is moderate.  Duration of 6 weeks.  Since onset it is stable.  I, the attending physician,  performed the HPI with the patient and updated documentation appropriately.        Comments   Pt here for 6 wk ret f/u CME OS. Pt states vision is the same, no changes. Blood sugar was 142 this AM. Last A1C was 6.4.      Last edited by [name], MD on 05/30/2021  3:52 PM.      Referring physician: A. Melissa Solum [place], [place] [address]  HISTORICAL INFORMATION:  Selected notes from the MEDICAL RECORD NUMBER Referral from Dr. 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    brimonidine (ALPHAGAN P) 0.1 % SOLN Apply 1 drop to eye 2 (two) times daily. 2 times daily in both eyes   latanoprost (XALATAN) 0.005 % ophthalmic solution Place 1 drop into both eyes at bedtime.    Loteprednol Etabonate (LOTEMAX SM) 0.38 % GEL Place 1 drop into the left eye 4 (four) times daily.   Bromfenac Sodium (PROLENSA) 0.07 % SOLN Place 1 drop into the left eye 4 (four) times daily. (Patient not taking: Reported on  05/30/2021)   dexamethasone (DECADRON) 0.1 % ophthalmic solution Place 1 drop into the left eye 4 (four) times daily. (Patient not taking: Reported on 05/30/2021)   No current facility-administered medications for this visit. (Ophthalmic Drugs)   Current Outpatient Medications (Other)  Medication Sig   aspirin 81 MG chewable tablet Chew 81 mg by mouth daily.   Continuous Blood Gluc Receiver (DEXCOM G6 RECEIVER) DEVI    Continuous Blood Gluc Sensor (DEXCOM G6 SENSOR) MISC SMARTSIG:1 Each Topical Every 10 Days   Continuous Blood Gluc Transmit (DEXCOM G6 TRANSMITTER) MISC    HUMALOG KWIKPEN 100 UNIT/ML KiwkPen    hydrochlorothiazide (HYDRODIURIL) 12.5 MG tablet Take by mouth.   hydrochlorothiazide (MICROZIDE) 12.5 MG capsule    Insulin Glargine 300 UNIT/ML SOPN Inject into the skin.   Loratadine 10 MG CAPS Take by mouth.   losartan (COZAAR) 100 MG tablet Take by mouth.   metFORMIN (GLUCOPHAGE-XR) 500 MG 24 hr tablet Take one tablet at supper for first week, then increase to two tablets at supper.   nebivolol (BYSTOLIC) 5 MG tablet Take by mouth.   NOVOFINE 32G X 6 MM MISC    ONE TOUCH ULTRA TEST test strip    simvastatin (ZOCOR) 20 MG tablet    triamterene-hydrochlorothiazide (  MAXZIDE-25) 37.5-25 MG tablet Take 1 tablet by mouth daily.   Dapagliflozin Propanediol (FARXIGA PO) Take 10 mg/day by mouth daily. (Patient not taking: Reported on 05/30/2021)   losartan (COZAAR) 100 MG tablet Take by mouth. (Patient not taking: Reported on 03/21/2021)   simvastatin (ZOCOR) 20 MG tablet Take by mouth. (Patient not taking: Reported on 03/21/2021)   No current facility-administered medications for this visit. (Other)    REVIEW OF SYSTEMS: ROS   Positive for: Eyes Negative for: Constitutional, Gastrointestinal, Neurological, Skin, Genitourinary, Musculoskeletal, HENT, Endocrine, Cardiovascular, Respiratory, Psychiatric, Allergic/Imm, Heme/Lymph Last edited by Kingsley Spittle, COT on 05/30/2021   9:07 AM.      ALLERGIES No Known Allergies  PAST MEDICAL HISTORY Past Medical History:  Diagnosis Date   Diabetes ([place])    Glaucoma    Hypertension    Past Surgical History:  Procedure Laterality Date   CATARACT EXTRACTION Right    EYE SURGERY Right    Cat Sx   EYE SURGERY Right    Trab   IRIDOTOMY / IRIDECTOMY Right    Trab   TRABECULECTOMY Right    YAG LASER APPLICATION Right     FAMILY HISTORY Family History  Problem Relation Age of Onset   Amblyopia Neg Hx    Blindness Neg Hx    Cataracts Neg Hx    Glaucoma Neg Hx    Macular degeneration Neg Hx    Retinal detachment Neg Hx    Strabismus Neg Hx    Retinitis pigmentosa Neg Hx     SOCIAL HISTORY Social History   Tobacco Use   Smoking status: Never   Smokeless tobacco: Never  Vaping Use   Vaping Use: Never used  Substance Use Topics   Alcohol use: Never   Drug use: Never       OPHTHALMIC EXAM:  Base Eye Exam     Visual Acuity (Snellen - Linear)       Right Left   Dist [place] 20/300 -1 20/30   Dist ph [place] NI 20/25         Tonometry (Tonopen, 9:15 AM)       Right Left   Pressure 15 13         Pupils       Dark Light Shape React APD   Right 3 2 Round Brisk None   Left 3 2 Round Brisk None         Visual Fields (Counting fingers)       Left Right    Full Full         Extraocular Movement       Right Left    Full, Ortho Full, Ortho         Neuro/Psych     Oriented x3: Yes   Mood/Affect: Normal         Dilation     Both eyes: 1.0% Mydriacyl, 2.5% Phenylephrine @ 9:17 AM           Slit Lamp and Fundus Exam     External Exam       Right Left   External Normal Normal         Slit Lamp Exam       Right Left   Lids/Lashes Dermatochalasis - upper lid Dermatochalasis - upper lid, mild Meibomian gland dysfunction   Conjunctiva/Sclera Bleb at 1200 White and quiet   Cornea 1+Punctate epithelial erosions, Well healed cataract wounds, arcus, tear film debris  Arcus, 1+Punctate epithelial erosions, well healed  cataract wound, trace Descemet's folds, decreased TBUT, mild tear film debris   Anterior Chamber Deep and quiet Deep and clear   Iris Round and moderately dilated, No NVI Round and moderately dilated   Lens Posterior chamber intraocular lens; open PC, mild anterior capsular phimosis toric PCIOL w/marks at 0315 and 0915, ant. capsule  phimosis, 1+PCO   Anterior Vitreous Vitreous syneresis - mild Posterior vitreous detachment, Weiss ring, Vitreous syneresis         Fundus Exam       Right Left   Disc Pink and Sharp, Compact Pink and Sharp, compact, temporal Peripapillary atrophy, +disc drusen   C/D Ratio 0.1 0.1   Macula Flat, Blunted foveal reflex, central RPE mottling, clumping and atrophy, mild ERM temporal macula, No heme or edema Flat, blunted foveal reflex, mild Retinal pigment epithelial mottling and clumping, trace Cystic changes -- stably resolved, No heme   Vessels Vascular attenuation Vascular attenuation, mild tortuousity, mild Copper wiring, mild AV crossing changes   Periphery Attached, rare MA Attached, mild reticular degeneration            IMAGING AND PROCEDURES  Imaging and Procedures for 08/22/17  OCT, Retina - OU - Both Eyes       Right Eye Quality was good. Central Foveal Thickness: 242. Progression has been stable. Findings include abnormal foveal contour, epiretinal membrane, subretinal hyper-reflective material, outer retinal atrophy, no IRF, no SRF, macular pucker, retinal drusen .   Left Eye Quality was good. Central Foveal Thickness: 355. Progression has been stable. Findings include normal foveal contour, no SRF, no IRF (Stable improvement in central cystic changes).   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 CME - cystoid macular edema. PED - pigment epithelial detachment. IRF - intraretinal fluid. SRF - subretinal fluid. EZ - ellipsoid zone. ERM - epiretinal membrane. ORA - outer retinal atrophy. ORT - outer retinal tubulation. SRHM - subretinal hyper-reflective material             ASSESSMENT/PLAN:   ICD-10-CM   1. CME (cystoid macular edema), left  H35.352     2. Retinal edema  H35.81 OCT, Retina - OU - Both Eyes    3. 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 Type 2 diabetes mellitus without complication, unspecified whether long term insulin use (HCC)  E11.9     5. Epiretinal membrane (ERM) of right eye  H35.371     6. Ocular hypertension, unspecified laterality  H40.059     7. History of trabeculectomy  Z98.890     8. Pseudophakia, both eyes  Z96.1     9. 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 CME OS  - s/p CEIOL OS (~7.13.22, Dr. Danne Baxter)  - completed steroid taper w/ Dr. Danne Baxter  - OCT showed +cystic changes / CME             - today, cystic changes stably resolved on OCT -- off Lotemax SM and Prolensa  - BCVA 20/20 OS  - recommend continuing to hold latanoprost OS due to PGA-associated CME  - f/u in July/August 2023 DFE/OCT  3.  History of CRVO w/ CME OD -- stable  - [place] stable at 20/300-  today  - history of intravitreal steroids and steroid response - see below  - OCT stable today: central foveal outer retinal atrophy but no IRF/SRF/edema  - monitor  - F/U 9-12 months  4. Diabetes mellitus, type 2 without retinopathy  - The incidence, risk factors for progression, natural history and treatment options for diabetic retinopathy  were discussed with patient.    - The need for close monitoring of blood glucose, blood pressure, and serum lipids, avoiding cigarette or any type of tobacco, and the need for long term follow up was also discussed with patient.  - f/u up in 1  year, sooner prn  5. ERM OD  - mild ERM/pucker in right eye that is limited by central atrophy 2/2 h/o CRVO  - monitor--no indication for surgery at this time  - will discuss if symptoms develop or symptoms worsen OD  6,7.  Steroid response / Ocular Hypertension s/p trabeculectomy OD  - IOP OD okay today -- 15 mmHg  - using Alphagan OU BID, Xalatan OD qd, Azopt OS BID as perscribed by Dr. Danne Baxter  - followed by Dr. Demetrios Loll at [facility] at [address], [place] [address], telephone :[phone] fax:[phone]  - monitor  8.  Pseudophakia OU  - s/p CE/IOL OU (Dr. Demetrios Loll)  - s/p yag capsulotomy OD  - beautiful surgeries  - ?rebound CME OS as above  - monitor  9. Optic disc drusen OS  - discussed diagnosis and significance to vision  - not visually significant at this point  - monitor  Ophthalmic Meds Ordered this visit:  No orders of the defined types were placed in this encounter.     Return for f/u July/August 2023, CME OS, DFE, OCT.  There are no Patient Instructions on file for this visit.  This document serves as a record of services personally performed by Gardiner Sleeper, MD, PhD. It was created on their behalf by Orvan Falconer, an ophthalmic technician. The creation of this record is the provider's dictation and/or activities during the visit.    Electronically signed by: Orvan Falconer, OA, 05/30/21  4:04 PM   This document serves as a record of services personally performed by Gardiner Sleeper, MD, PhD. It was created on their behalf by San Jetty. Owens Shark, OA an ophthalmic technician. The creation of this record is the provider's dictation and/or activities during the visit.    Electronically signed by: San Jetty. Owens Shark, [place] 11.23.2022 4:04 PM   Gardiner Sleeper, M.D., Ph.D. Diseases & Surgery of the Retina and Vitreous [facility]  I have reviewed the above documentation  for accuracy and completeness, and I agree with the above. Gardiner Sleeper, M.D., Ph.D. 05/30/21 4:04 PM   Abbreviations: M myopia (nearsighted); A astigmatism; H hyperopia (farsighted); P presbyopia; Mrx spectacle prescription;  CTL contact lenses; OD right eye; OS left eye; OU both eyes  XT exotropia; ET esotropia; PEK punctate epithelial keratitis; PEE punctate epithelial erosions; DES dry eye syndrome; MGD meibomian gland dysfunction; ATs artificial tears; PFAT's preservative free artificial tears; [place] nuclear sclerotic cataract; PSC posterior subcapsular cataract; ERM epi-retinal membrane; PVD posterior vitreous detachment; RD retinal detachment; DM diabetes mellitus; DR diabetic retinopathy; NPDR non-proliferative diabetic retinopathy; PDR proliferative diabetic retinopathy; CSME clinically significant macular edema; DME diabetic macular edema; dbh dot blot hemorrhages; CWS cotton wool spot; POAG primary open angle glaucoma; C/D cup-to-disc ratio; HVF humphrey visual field; GVF goldmann visual field;  OCT optical coherence tomography; IOP intraocular pressure; BRVO Branch retinal vein occlusion; CRVO central retinal vein occlusion; CRAO central retinal artery occlusion; BRAO branch retinal artery occlusion; RT retinal tear; SB scleral buckle; PPV pars plana vitrectomy; VH Vitreous hemorrhage; PRP panretinal laser photocoagulation; IVK intravitreal kenalog; VMT vitreomacular traction; MH Macular hole;  NVD neovascularization of the disc; NVE neovascularization elsewhere; AREDS age related eye disease study; ARMD age related macular degeneration; POAG primary open angle glaucoma; EBMD epithelial/anterior basement membrane dystrophy; ACIOL anterior chamber intraocular lens; IOL intraocular lens; PCIOL posterior chamber intraocular lens; Phaco/IOL phacoemulsification with intraocular lens placement; [place] photorefractive keratectomy; LASIK laser assisted in situ keratomileusis; HTN hypertension; DM diabetes  mellitus; COPD chronic obstructive pulmonary disease

## 2021-05-30 ENCOUNTER — Encounter (INDEPENDENT_AMBULATORY_CARE_PROVIDER_SITE_OTHER): Payer: Self-pay | Admitting: Ophthalmology

## 2021-05-30 ENCOUNTER — Other Ambulatory Visit: Payer: Self-pay

## 2021-05-30 ENCOUNTER — Ambulatory Visit (INDEPENDENT_AMBULATORY_CARE_PROVIDER_SITE_OTHER): Payer: Medicare Other | Admitting: Ophthalmology

## 2021-05-30 DIAGNOSIS — H34811 Central retinal vein occlusion, right eye, with macular edema: Secondary | ICD-10-CM | POA: Diagnosis not present

## 2021-05-30 DIAGNOSIS — H35371 Puckering of macula, right eye: Secondary | ICD-10-CM | POA: Diagnosis not present

## 2021-05-30 DIAGNOSIS — Z9889 Other specified postprocedural states: Secondary | ICD-10-CM

## 2021-05-30 DIAGNOSIS — Z961 Presence of intraocular lens: Secondary | ICD-10-CM

## 2021-05-30 DIAGNOSIS — H40059 Ocular hypertension, unspecified eye: Secondary | ICD-10-CM | POA: Diagnosis not present

## 2021-05-30 DIAGNOSIS — E119 Type 2 diabetes mellitus without complications: Secondary | ICD-10-CM

## 2021-05-30 DIAGNOSIS — H3581 Retinal edema: Secondary | ICD-10-CM

## 2021-05-30 DIAGNOSIS — H35352 Cystoid macular degeneration, left eye: Secondary | ICD-10-CM

## 2021-05-30 DIAGNOSIS — H47322 Drusen of optic disc, left eye: Secondary | ICD-10-CM

## 2021-07-22 LAB — COLOGUARD: COLOGUARD: NEGATIVE

## 2021-10-10 DIAGNOSIS — E119 Type 2 diabetes mellitus without complications: Secondary | ICD-10-CM | POA: Insufficient documentation

## 2021-10-10 DIAGNOSIS — Z7982 Long term (current) use of aspirin: Secondary | ICD-10-CM | POA: Diagnosis not present

## 2021-10-10 DIAGNOSIS — R778 Other specified abnormalities of plasma proteins: Secondary | ICD-10-CM | POA: Diagnosis not present

## 2021-10-10 DIAGNOSIS — N179 Acute kidney failure, unspecified: Secondary | ICD-10-CM | POA: Insufficient documentation

## 2021-10-10 DIAGNOSIS — Z794 Long term (current) use of insulin: Secondary | ICD-10-CM | POA: Insufficient documentation

## 2021-10-10 DIAGNOSIS — I10 Essential (primary) hypertension: Secondary | ICD-10-CM | POA: Diagnosis not present

## 2021-10-10 DIAGNOSIS — R278 Other lack of coordination: Secondary | ICD-10-CM | POA: Diagnosis present

## 2021-10-10 DIAGNOSIS — I639 Cerebral infarction, unspecified: Secondary | ICD-10-CM | POA: Diagnosis not present

## 2021-10-10 DIAGNOSIS — Z79899 Other long term (current) drug therapy: Secondary | ICD-10-CM | POA: Insufficient documentation

## 2021-10-10 DIAGNOSIS — Z7984 Long term (current) use of oral hypoglycemic drugs: Secondary | ICD-10-CM | POA: Diagnosis not present

## 2021-10-11 ENCOUNTER — Observation Stay: Payer: Medicare Other

## 2021-10-11 ENCOUNTER — Emergency Department: Payer: Medicare Other

## 2021-10-11 ENCOUNTER — Other Ambulatory Visit: Payer: Self-pay

## 2021-10-11 ENCOUNTER — Observation Stay
Admission: EM | Admit: 2021-10-11 | Discharge: 2021-10-12 | Disposition: A | Discharge status: Home / Self Care | Payer: Medicare Other | Attending: Internal Medicine | Admitting: Internal Medicine

## 2021-10-11 ENCOUNTER — Encounter: Payer: Self-pay | Admitting: Emergency Medicine

## 2021-10-11 DIAGNOSIS — G459 Transient cerebral ischemic attack, unspecified: Secondary | ICD-10-CM | POA: Diagnosis not present

## 2021-10-11 DIAGNOSIS — E669 Obesity, unspecified: Secondary | ICD-10-CM

## 2021-10-11 DIAGNOSIS — I639 Cerebral infarction, unspecified: Secondary | ICD-10-CM | POA: Diagnosis not present

## 2021-10-11 DIAGNOSIS — R200 Anesthesia of skin: Secondary | ICD-10-CM

## 2021-10-11 DIAGNOSIS — N179 Acute kidney failure, unspecified: Secondary | ICD-10-CM | POA: Diagnosis not present

## 2021-10-11 DIAGNOSIS — E1169 Type 2 diabetes mellitus with other specified complication: Secondary | ICD-10-CM

## 2021-10-11 DIAGNOSIS — Z794 Long term (current) use of insulin: Secondary | ICD-10-CM

## 2021-10-11 DIAGNOSIS — R278 Other lack of coordination: Principal | ICD-10-CM

## 2021-10-11 DIAGNOSIS — I63411 Cerebral infarction due to embolism of right middle cerebral artery: Secondary | ICD-10-CM

## 2021-10-11 LAB — CBC
HCT: 38.8 % — ABNORMAL LOW (ref 39.0–52.0)
HCT: 41.4 % (ref 39.0–52.0)
Hemoglobin: 12.1 g/dL — ABNORMAL LOW (ref 13.0–17.0)
Hemoglobin: 13.1 g/dL (ref 13.0–17.0)
MCH: 27.8 pg (ref 26.0–34.0)
MCH: 27.9 pg (ref 26.0–34.0)
MCHC: 31.2 g/dL (ref 30.0–36.0)
MCHC: 31.6 g/dL (ref 30.0–36.0)
MCV: 88.1 fL (ref 80.0–100.0)
MCV: 89.2 fL (ref 80.0–100.0)
Platelets: 203 10*3/uL (ref 150–400)
Platelets: 245 10*3/uL (ref 150–400)
RBC: 4.35 MIL/uL (ref 4.22–5.81)
RBC: 4.7 MIL/uL (ref 4.22–5.81)
RDW: 13.2 % (ref 11.5–15.5)
RDW: 13.3 % (ref 11.5–15.5)
WBC: 8.4 10*3/uL (ref 4.0–10.5)
WBC: 9.5 10*3/uL (ref 4.0–10.5)
nRBC: 0 % (ref 0.0–0.2)
nRBC: 0 % (ref 0.0–0.2)

## 2021-10-11 LAB — DIFFERENTIAL
Abs Immature Granulocytes: 0.06 10*3/uL (ref 0.00–0.07)
Basophils Absolute: 0.1 10*3/uL (ref 0.0–0.1)
Basophils Relative: 1 %
Eosinophils Absolute: 0.3 10*3/uL (ref 0.0–0.5)
Eosinophils Relative: 3 %
Immature Granulocytes: 1 %
Lymphocytes Relative: 26 %
Lymphs Abs: 2.5 10*3/uL (ref 0.7–4.0)
Monocytes Absolute: 0.9 10*3/uL (ref 0.1–1.0)
Monocytes Relative: 10 %
Neutro Abs: 5.7 10*3/uL (ref 1.7–7.7)
Neutrophils Relative %: 59 %

## 2021-10-11 LAB — COMPREHENSIVE METABOLIC PANEL
ALT: 13 U/L (ref 0–44)
AST: 18 U/L (ref 15–41)
Albumin: 3.9 g/dL (ref 3.5–5.0)
Alkaline Phosphatase: 73 U/L (ref 38–126)
Anion gap: 10 (ref 5–15)
BUN: 36 mg/dL — ABNORMAL HIGH (ref 8–23)
CO2: 26 mmol/L (ref 22–32)
Calcium: 9.4 mg/dL (ref 8.9–10.3)
Chloride: 101 mmol/L (ref 98–111)
Creatinine, Ser: 1.38 mg/dL — ABNORMAL HIGH (ref 0.61–1.24)
GFR, Estimated: 52 mL/min — ABNORMAL LOW (ref >60)
Glucose, Bld: 99 mg/dL (ref 70–99)
Potassium: 3.7 mmol/L (ref 3.5–5.1)
Sodium: 137 mmol/L (ref 135–145)
Total Bilirubin: 0.6 mg/dL (ref 0.3–1.2)
Total Protein: 7.5 g/dL (ref 6.5–8.1)

## 2021-10-11 LAB — URINE DRUG SCREEN, QUALITATIVE (ARMC ONLY)
Amphetamines, Ur Screen: NOT DETECTED
Barbiturates, Ur Screen: NOT DETECTED
Benzodiazepine, Ur Scrn: NOT DETECTED
Cannabinoid 50 Ng, Ur ~~LOC~~: NOT DETECTED
Cocaine Metabolite,Ur ~~LOC~~: NOT DETECTED
MDMA (Ecstasy)Ur Screen: NOT DETECTED
Methadone Scn, Ur: NOT DETECTED
Opiate, Ur Screen: NOT DETECTED
Phencyclidine (PCP) Ur S: NOT DETECTED
Tricyclic, Ur Screen: NOT DETECTED

## 2021-10-11 LAB — CBG MONITORING, ED
Glucose-Capillary: 104 mg/dL — ABNORMAL HIGH (ref 70–99)
Glucose-Capillary: 108 mg/dL — ABNORMAL HIGH (ref 70–99)
Glucose-Capillary: 126 mg/dL — ABNORMAL HIGH (ref 70–99)
Glucose-Capillary: 169 mg/dL — ABNORMAL HIGH (ref 70–99)
Glucose-Capillary: 201 mg/dL — ABNORMAL HIGH (ref 70–99)
Glucose-Capillary: 60 mg/dL — ABNORMAL LOW (ref 70–99)

## 2021-10-11 LAB — URINALYSIS, ROUTINE W REFLEX MICROSCOPIC
Bacteria, UA: NONE SEEN
Bilirubin Urine: NEGATIVE
Glucose, UA: 150 mg/dL — AB
Hgb urine dipstick: NEGATIVE
Ketones, ur: NEGATIVE mg/dL
Leukocytes,Ua: NEGATIVE
Nitrite: NEGATIVE
Protein, ur: 100 mg/dL — AB
Specific Gravity, Urine: 1.024 (ref 1.005–1.030)
pH: 5 (ref 5.0–8.0)

## 2021-10-11 LAB — LIPID PANEL
Cholesterol: 137 mg/dL (ref 0–200)
HDL: 41 mg/dL (ref >40)
LDL Cholesterol: 81 mg/dL (ref 0–99)
Total CHOL/HDL Ratio: 3.3 RATIO
Triglycerides: 75 mg/dL (ref <150)
VLDL: 15 mg/dL (ref 0–40)

## 2021-10-11 LAB — GLUCOSE, CAPILLARY
Glucose-Capillary: 156 mg/dL — ABNORMAL HIGH (ref 70–99)
Glucose-Capillary: 130 mg/dL — ABNORMAL HIGH (ref 70–99)

## 2021-10-11 LAB — APTT: aPTT: 27 seconds (ref 24–36)

## 2021-10-11 LAB — ETHANOL: Alcohol, Ethyl (B): <10 mg/dL (ref <10)

## 2021-10-11 LAB — PROTIME-INR
INR: 1.2 (ref 0.8–1.2)
Prothrombin Time: 14.6 seconds (ref 11.4–15.2)

## 2021-10-11 LAB — TROPONIN I (HIGH SENSITIVITY)
Troponin I (High Sensitivity): 28 ng/L — ABNORMAL HIGH (ref <18)
Troponin I (High Sensitivity): 26 ng/L — ABNORMAL HIGH (ref <18)

## 2021-10-11 LAB — TSH: TSH: 3.119 u[IU]/mL (ref 0.350–4.500)

## 2021-10-11 IMAGING — CT CT HEAD CODE STROKE
4 series · 16 of 47 positions shown, 18 images · non-contrast
Comparison: None.

CLINICAL DATA: Code stroke.  Sudden onset left arm weakness



[Series 3: head wo · axial · 0.46mm/px · z∈[-158,-43]mm · 7 of 31 slices shown, 9 images]
[im 4/31  brain]
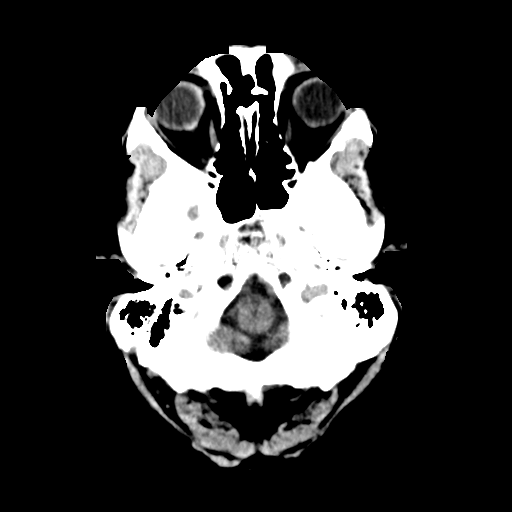
[im 4/31  bone]
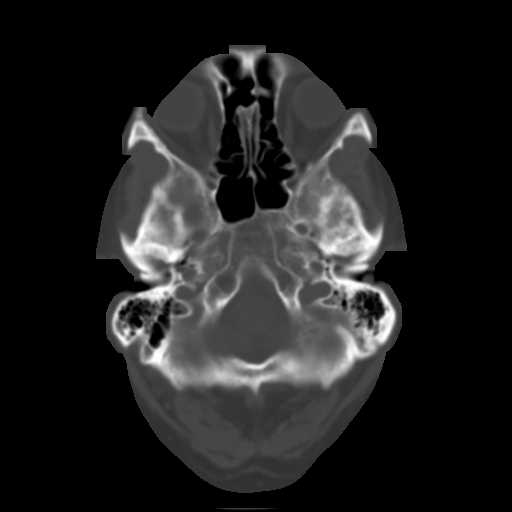
[im 8/31  brain]
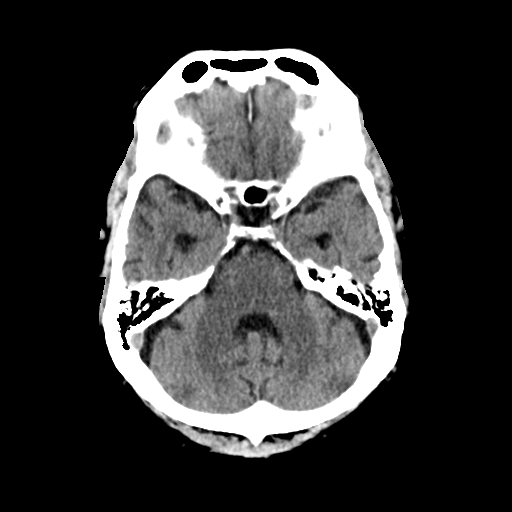
[im 12/31  brain]
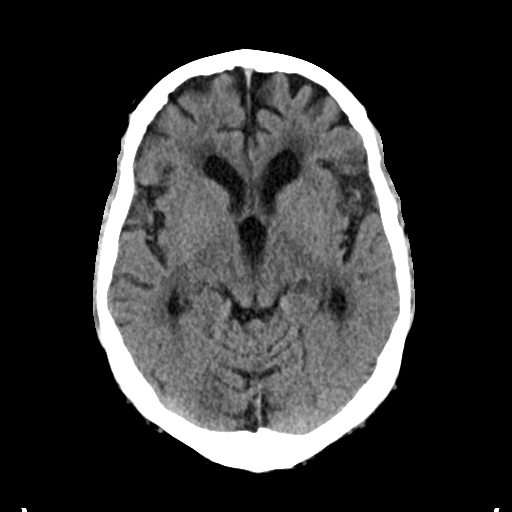
[im 16/31  brain]
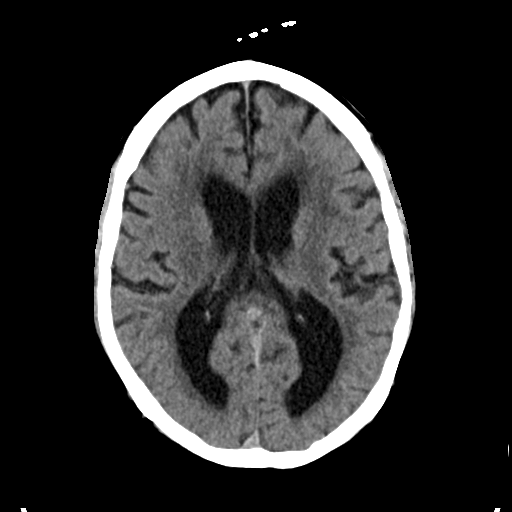
[im 19/31  brain]
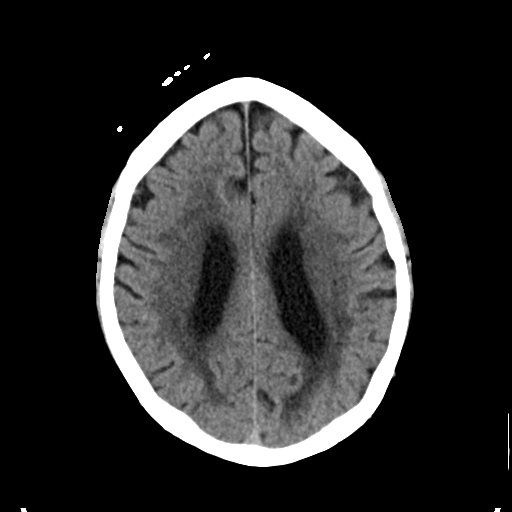
[im 19/31  bone]
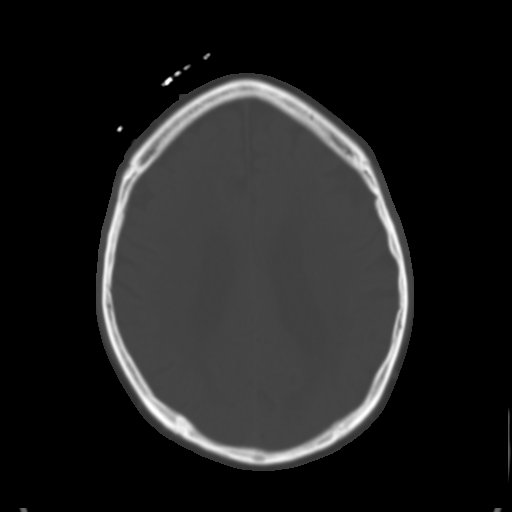
[im 23/31  brain]
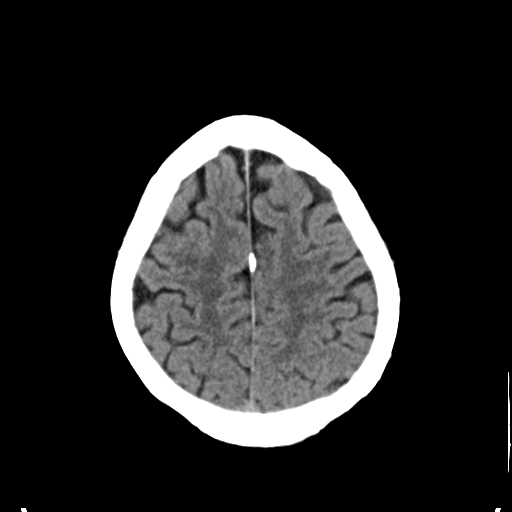
[im 27/31  brain]
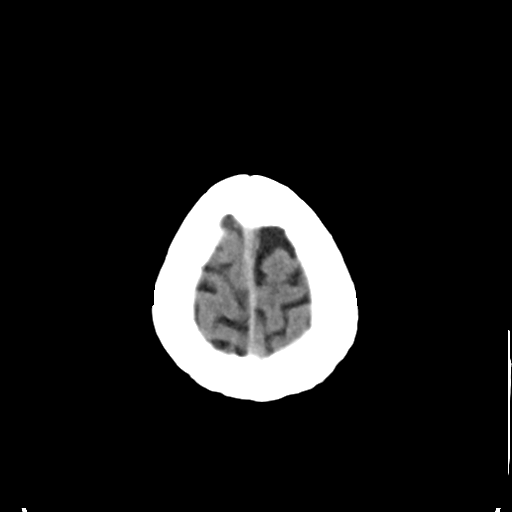

[Series 4: head bone · axial · 0.46mm/px · z∈[-159,-129]mm · 3 of 77 slices shown]
[im 8/77  bone]
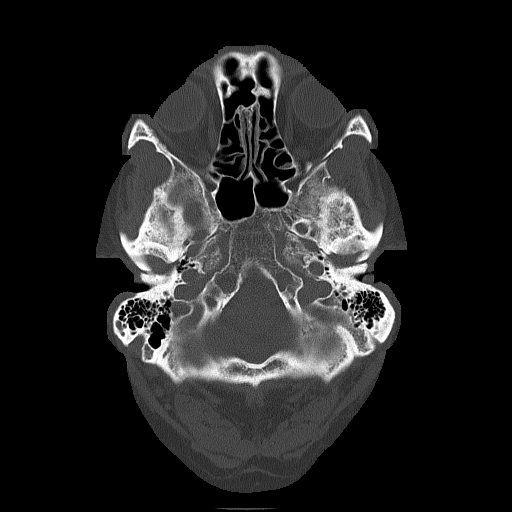
[im 16/77  bone]
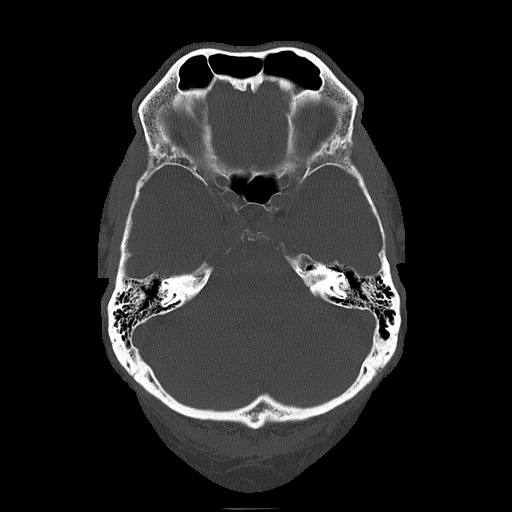
[im 23/77  bone]
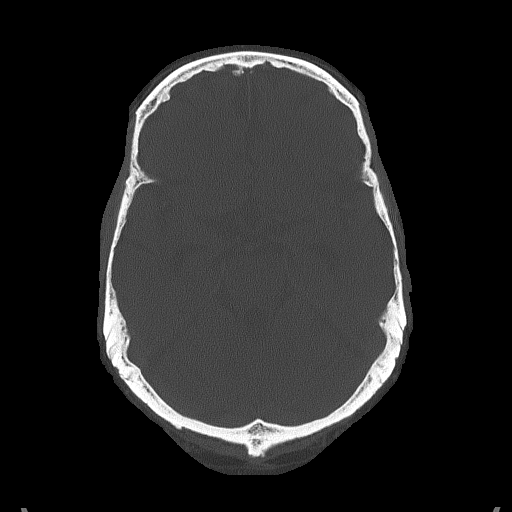

[Series 5: coronal soft tissue · coronal · 0.33mm/px · 3 of 70 slices shown]
[im 24/70  brain]
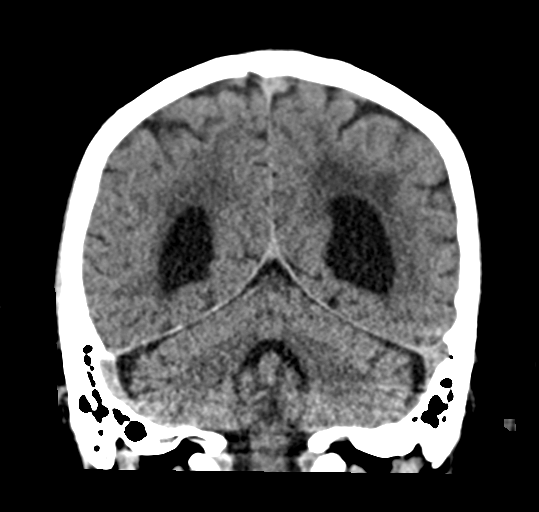
[im 31/70  brain]
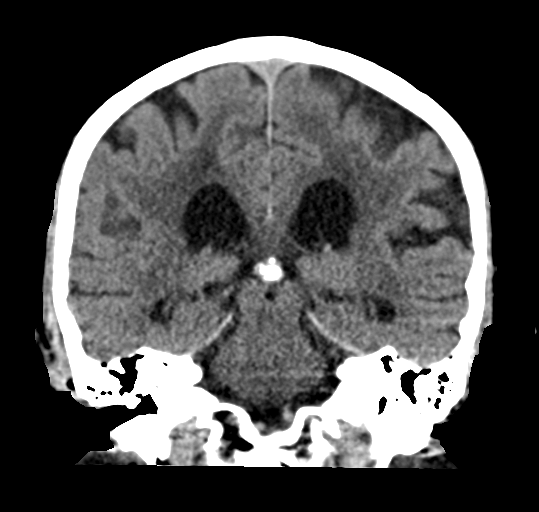
[im 39/70  brain]
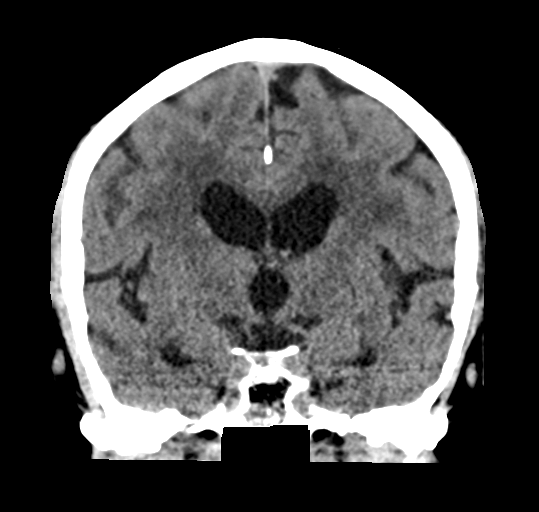

[Series 6: sagittal soft tissue · sagittal · 0.31mm/px · 3 of 58 slices shown]
[im 20/58  brain]
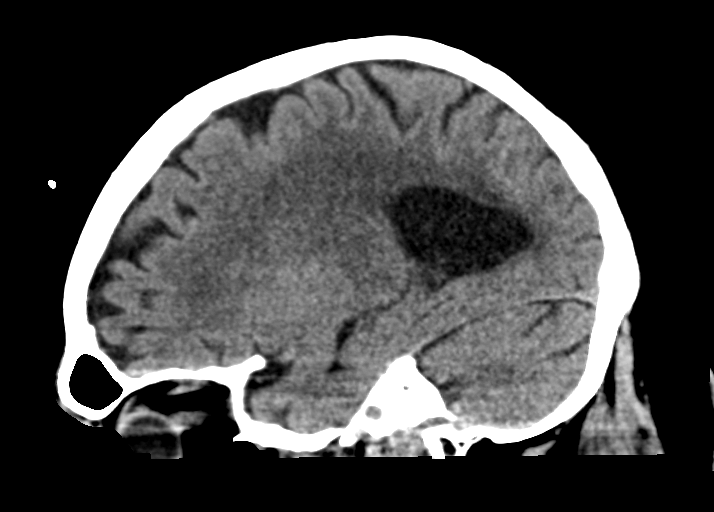
[im 29/58  brain]
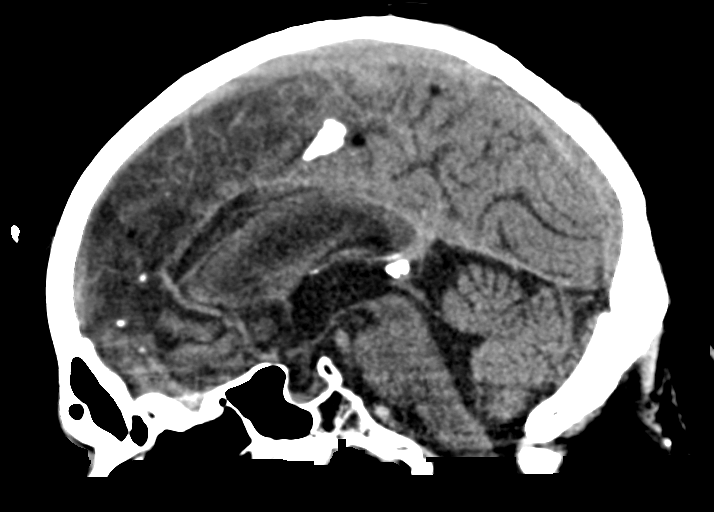
[im 39/58  brain]
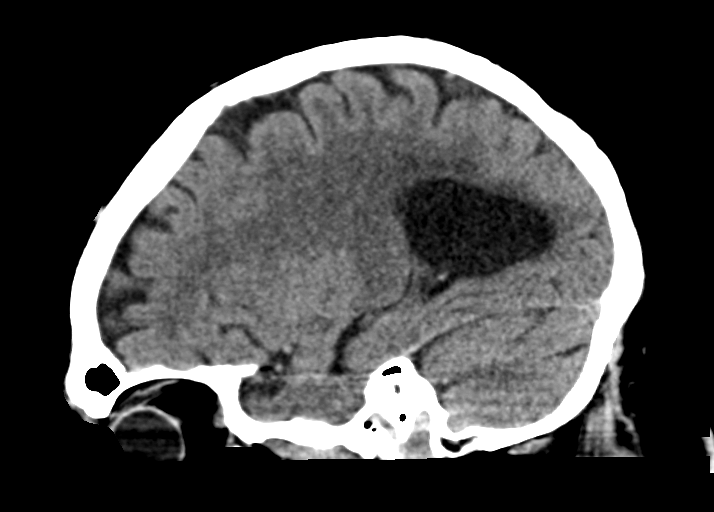

[16 of 47 positions shown; findings below may reference images not displayed]

FINDINGS: Brain: There is no mass, hemorrhage or extra-axial collection. There
is generalized atrophy without lobar predilection. There is
hypoattenuation of the periventricular white matter, most commonly
indicating chronic ischemic microangiopathy.

Vascular: No abnormal hyperdensity of the major intracranial
arteries or dural venous sinuses. No intracranial atherosclerosis.

Skull: The visualized skull base, calvarium and extracranial soft
tissues are normal.

Sinuses/Orbits: No fluid levels or advanced mucosal thickening of
the visualized paranasal sinuses. No mastoid or middle ear effusion.
The orbits are normal.

ASPECTS ([place] Stroke Program Early CT Score)

- Ganglionic level infarction (caudate, lentiform nuclei, internal
capsule, insula, M1-M3 cortex): 7

- Supraganglionic infarction (M4-M6 cortex): 3

Total score (0-10 with 10 being normal): 10
IMPRESSION: 1. No acute intracranial abnormality.
2. ASPECTS is 10.

These results were called by telephone at the time of interpretation
on [DATE] at [DATE] to provider ARVIZO , who verbally
acknowledged these results.

## 2021-10-11 IMAGING — MR MR HEAD W/O CM
11 series · 46 of 48 positions shown · non-contrast
Comparison: None.
COMPARISON: None.

Addendum:
CLINICAL DATA: Acute neurologic deficit

EXAM:
MRI HEAD WITHOUT CONTRAST
TECHNIQUE: Multiplanar, multiecho pulse sequences of the brain and surrounding
structures were obtained without intravenous contrast.

[Series 5: ax dwi_tracew · axial · 3.0mm · 0.65mm/px · z∈[-41,+112]mm · 4 of 48 slices shown]
[im 1/48]
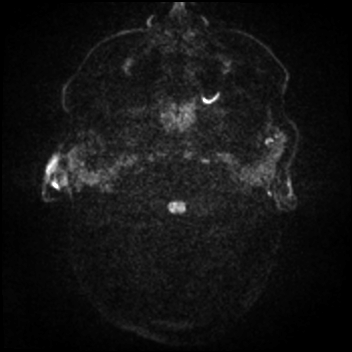
[im 16/48]
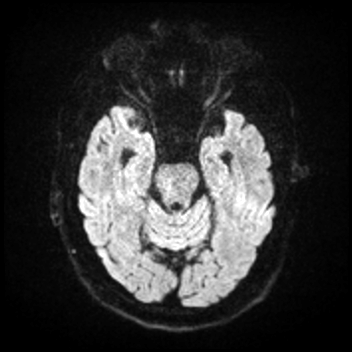
[im 32/48]
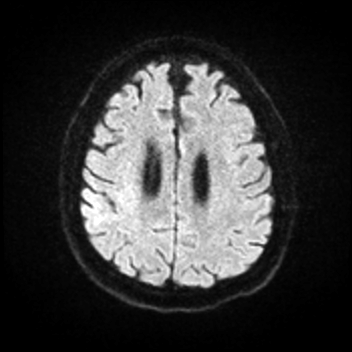
[im 48/48]
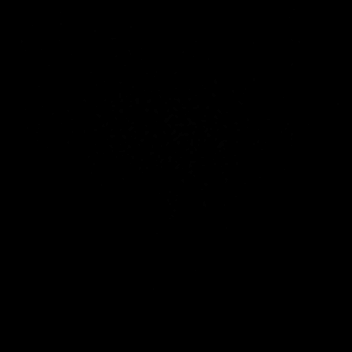

[Series 6: ax dwi_adc · axial · 3.0mm · 0.65mm/px · z∈[-41,+109]mm · 4 of 47 slices shown]
[im 1/47]
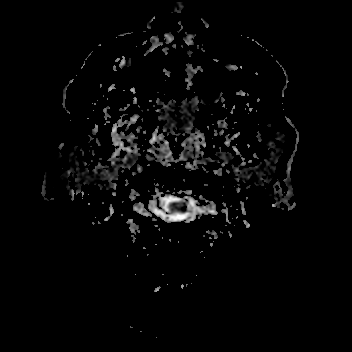
[im 16/47]
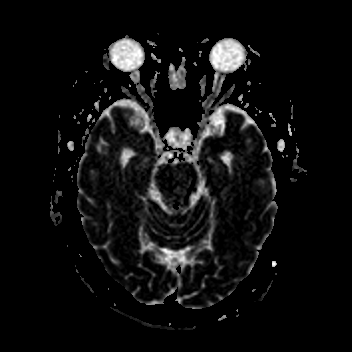
[im 31/47]
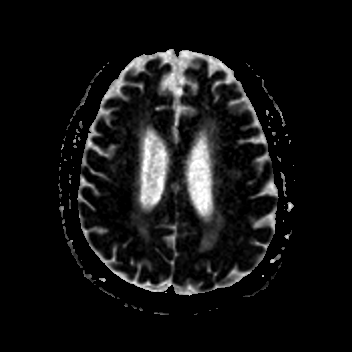
[im 47/47]
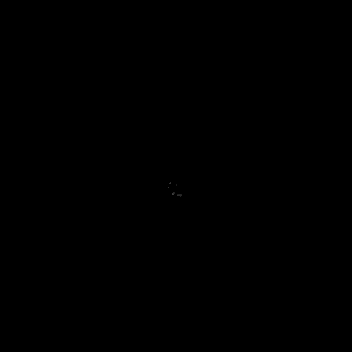

[Series 7: cor dwi_tracew · coronal · 5.0mm · 0.65mm/px · 3 of 40 slices shown]
[im 1/40]
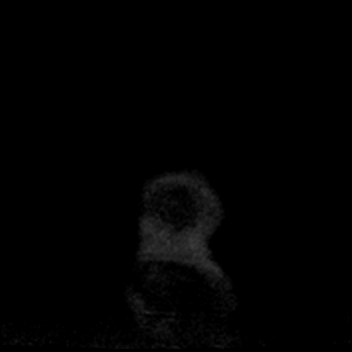
[im 20/40]
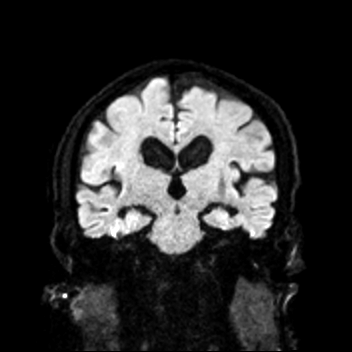
[im 40/40]
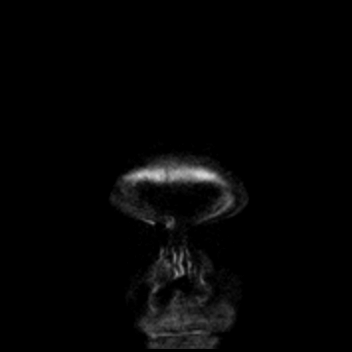

[Series 8: cor dwi_adc · coronal · 5.0mm · 0.65mm/px · 3 of 40 slices shown]
[im 1/40]
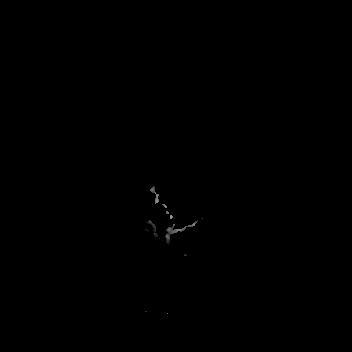
[im 20/40]
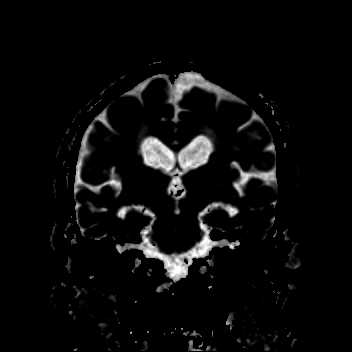
[im 40/40]
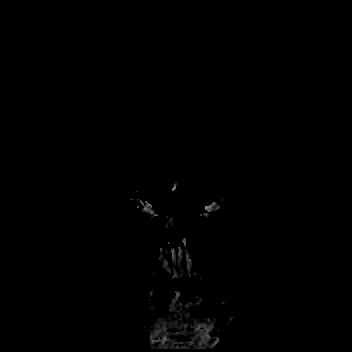

[Series 9: T1 · sagittal · 5.0mm · 0.62mm/px · 2 of 23 slices shown (1 of 2)]
[im 1/23]
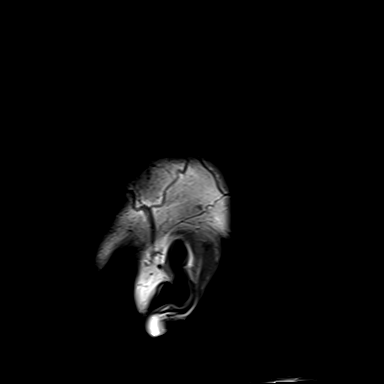
[im 23/23]
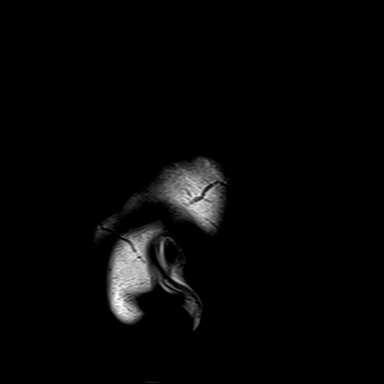

[Series 10: T2 · axial · 5.0mm · 0.53mm/px · z∈[-42,+112]mm · 2 of 27 slices shown (1 of 2)]
[im 1/27]
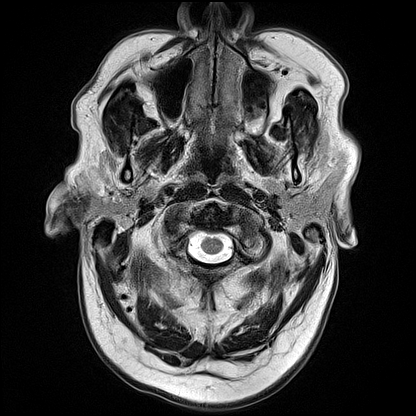
[im 27/27]
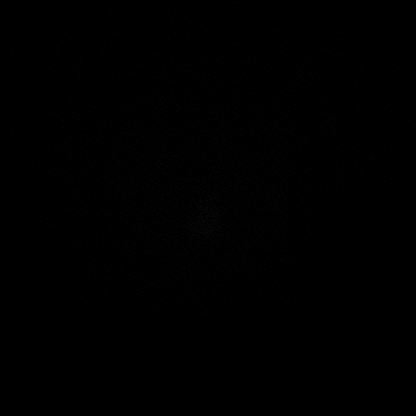

[Series 12: pha_images · axial · 3.0mm · 0.90mm/px · z∈[-52,+123]mm · 5 of 60 slices shown]
[im 1/60]
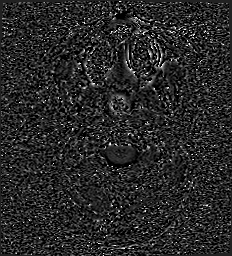
[im 15/60]
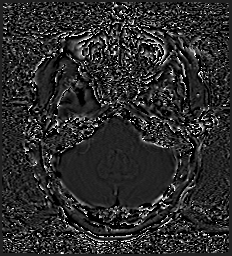
[im 30/60]
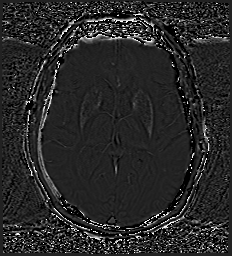
[im 45/60]
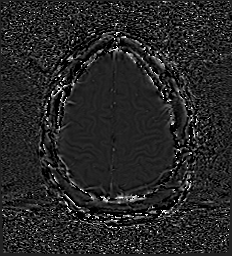
[im 60/60]
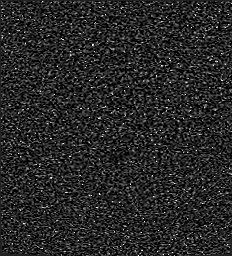

[Series 13: swi_images · axial · 3.0mm · 0.90mm/px · z∈[-52,+120]mm · 5 of 59 slices shown]
[im 1/59]
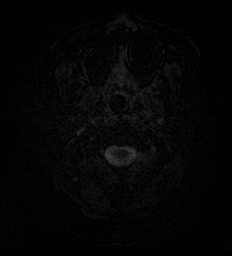
[im 15/59]
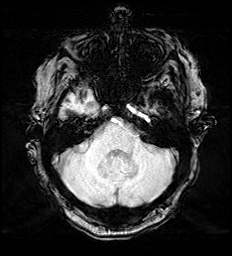
[im 30/59]
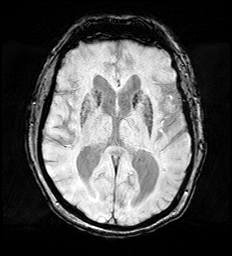
[im 44/59]
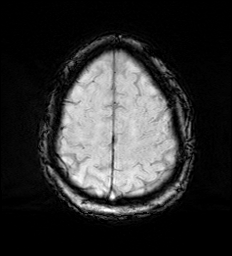
[im 59/59]
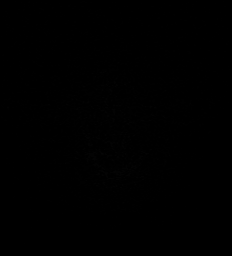

[Series 15: FLAIR · axial · 3.0mm · 0.69mm/px · z∈[-45,+115]mm · 4 of 55 slices shown]
[im 1/55]
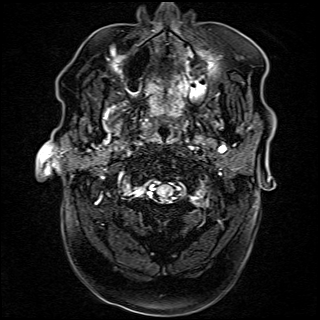
[im 19/55]
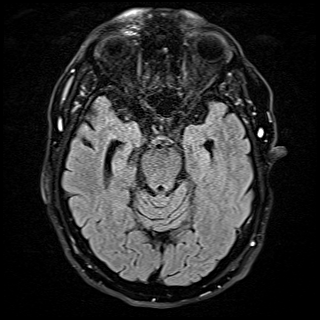
[im 37/55]
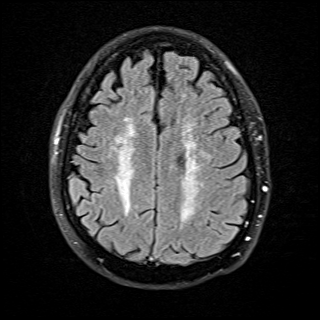
[im 55/55]
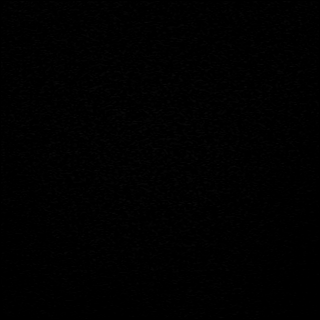

[Series 16: T1 · axial · 1.0mm · 0.98mm/px · z∈[-50,+122]mm · 12 of 175 slices shown (2 of 2)]
[im 1/175]
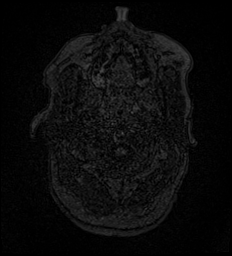
[im 14/175]
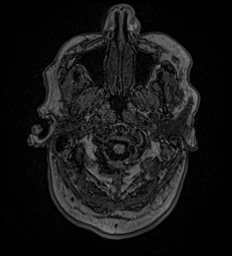
[im 27/175]
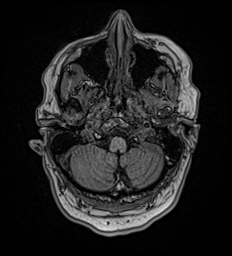
[im 41/175]
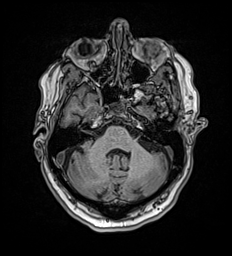
[im 54/175]
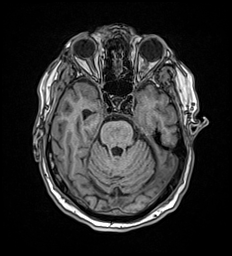
[im 67/175]
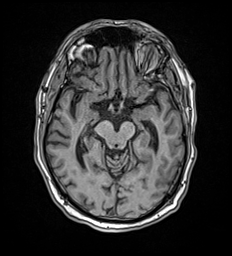
[im 81/175]
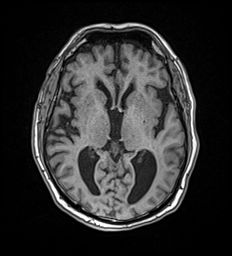
[im 94/175]
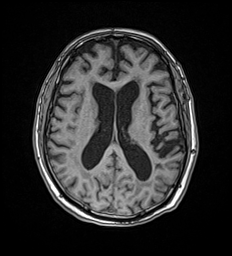
[im 108/175]
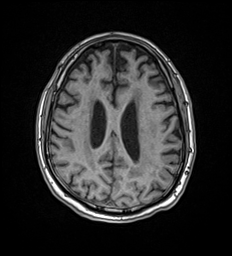
[im 121/175]
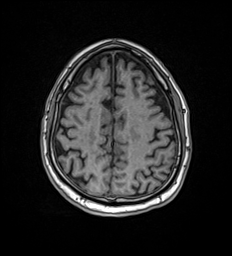
[im 148/175]
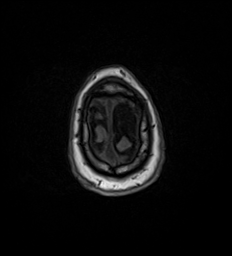
[im 175/175]
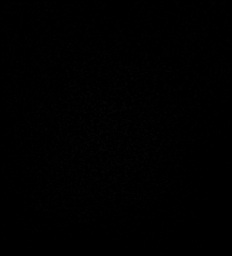

[Series 17: T2 · coronal · 5.0mm · 0.45mm/px · 2 of 31 slices shown (2 of 2)]
[im 1/31]
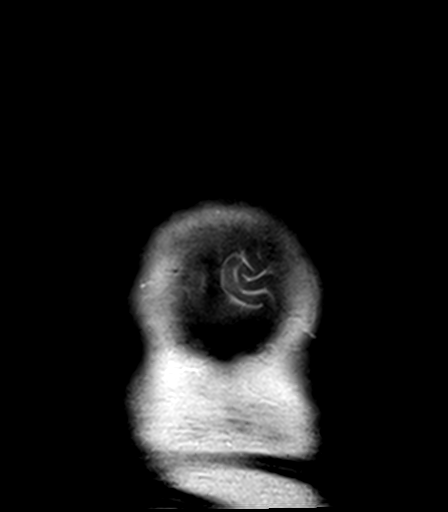
[im 31/31]
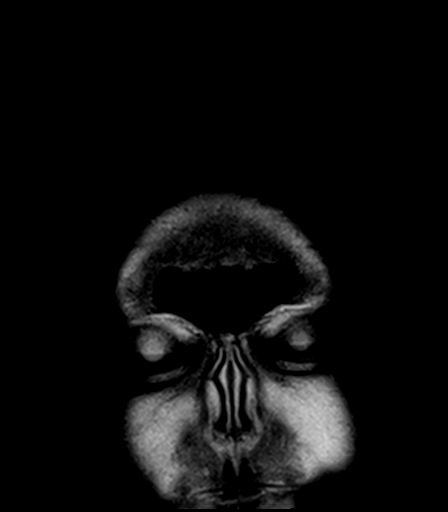

[46 of 48 positions shown; findings below may reference images not displayed]

FINDINGS: Brain: No acute infarct, mass effect or extra-axial collection. No
acute or chronic hemorrhage. There is multifocal hyperintense
T2-weighted signal within the white matter. Generalized volume loss
without a clear lobar predilection. The midline structures are
normal.

Vascular: Major flow voids are preserved.

Skull and upper cervical spine: Normal calvarium and skull base.
Visualized upper cervical spine and soft tissues are normal.

Sinuses/Orbits:No paranasal sinus fluid levels or advanced mucosal
thickening. No mastoid or middle ear effusion. Normal orbits.
IMPRESSION: 1. No acute intracranial abnormality.
2. Findings of chronic small vessel ischemia and generalized volume
loss.

ADDENDUM:
Case reviewed for CTA and there is a small acute right frontal
cortex infarct anteriorly on [DATE]. There is subtle DWI
hyperintensity in the right perirolandic cortex on [DATE], which may
relate to the arm symptoms.

*** End of Addendum ***
FINDINGS: Brain: No acute infarct, mass effect or extra-axial collection. No
acute or chronic hemorrhage. There is multifocal hyperintense
T2-weighted signal within the white matter. Generalized volume loss
without a clear lobar predilection. The midline structures are
normal.

Vascular: Major flow voids are preserved.

Skull and upper cervical spine: Normal calvarium and skull base.
Visualized upper cervical spine and soft tissues are normal.

Sinuses/Orbits:No paranasal sinus fluid levels or advanced mucosal
thickening. No mastoid or middle ear effusion. Normal orbits.
IMPRESSION: 1. No acute intracranial abnormality.
2. Findings of chronic small vessel ischemia and generalized volume
loss.

## 2021-10-11 IMAGING — CT CT ANGIO HEAD-NECK (W OR W/O PERF)
2 of 7 series · 8 of 33 positions shown · non-contrast
Comparison: CT and MRI of the brain from earlier today

CLINICAL DATA: Stroke workup.  Left arm weakness

EXAM:
CT ANGIOGRAPHY HEAD AND NECK
TECHNIQUE: Multidetector CT imaging of the head and neck was performed using
the standard protocol during bolus administration of intravenous
contrast. Multiplanar CT image reconstructions and MIPs were
obtained to evaluate the vascular anatomy. Carotid stenosis
measurements (when applicable) are obtained utilizing NASCET
criteria, using the distal internal carotid diameter as the
denominator.

[Series 5: cta neck/head · axial · 0.48mm/px · z∈[-271,-149]mm · 2 of 184 slices shown]
[im 62/184  soft-tissue]
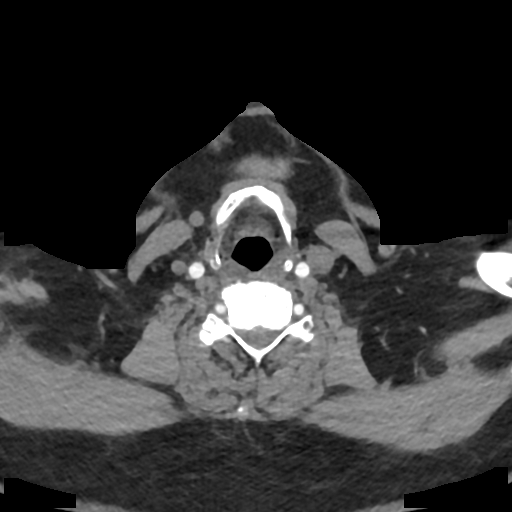
[im 123/184  soft-tissue]
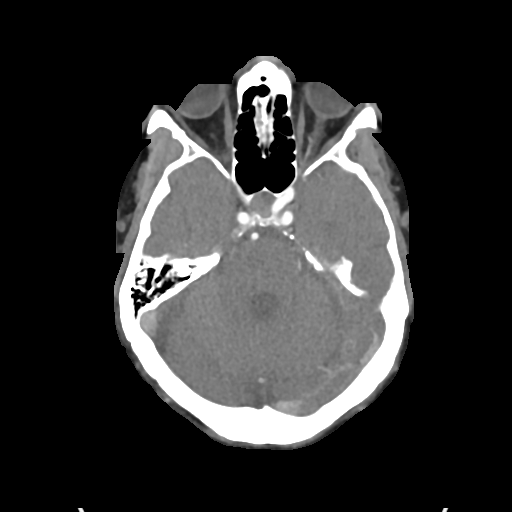

[Series 7: ax thins · axial · 0.39mm/px · z∈[-341,-81]mm · 6 of 365 slices shown]
[im 53/365  soft-tissue]
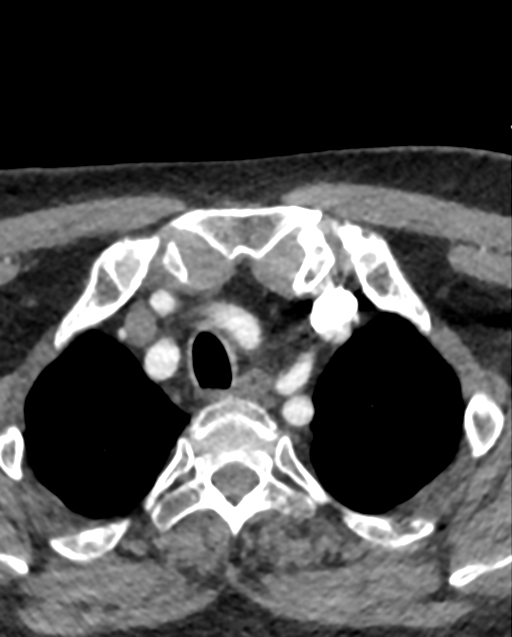
[im 105/365  bone]
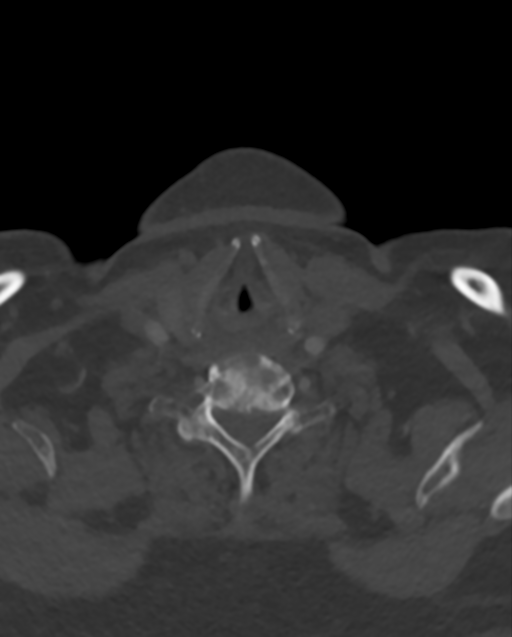
[im 157/365  soft-tissue]
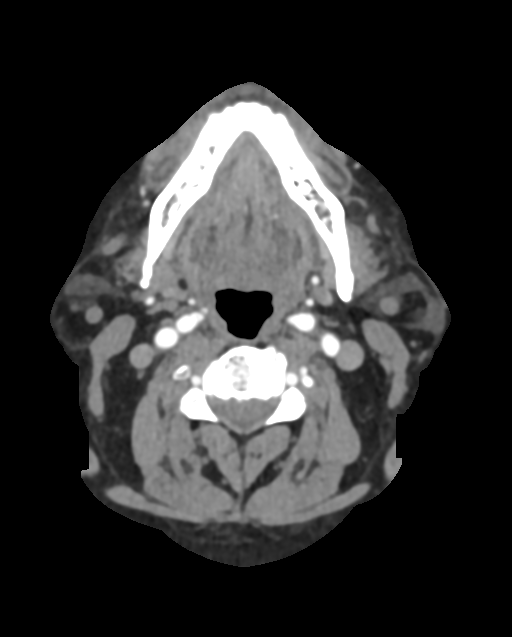
[im 209/365  bone]
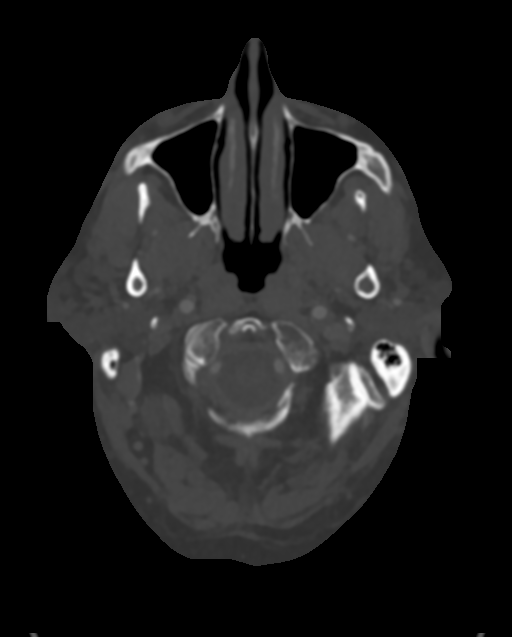
[im 261/365  soft-tissue]
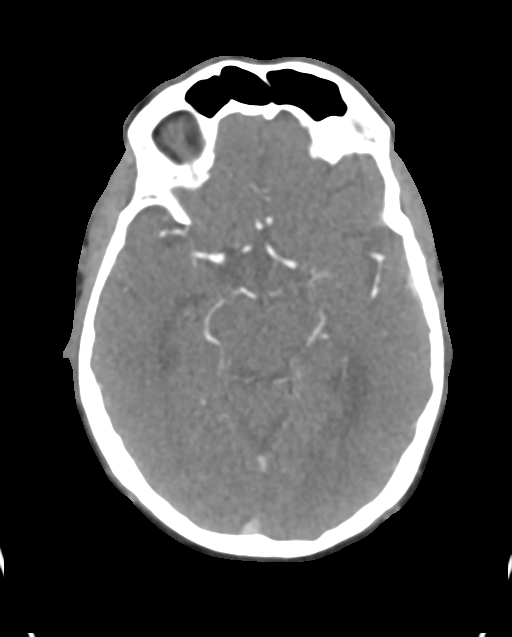
[im 313/365  bone]
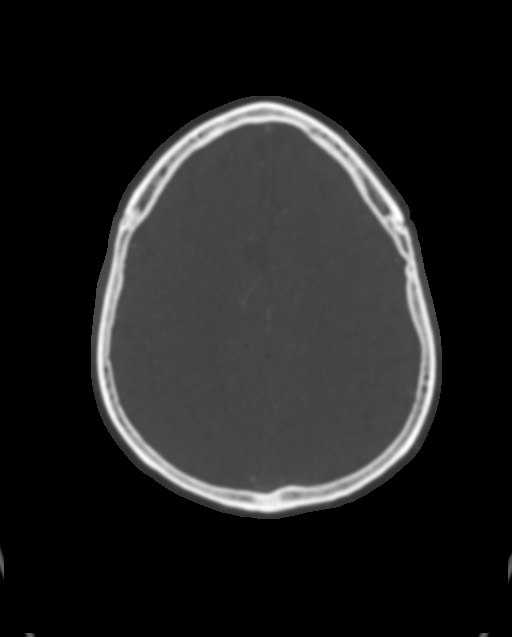

[8 of 33 positions shown; findings below may reference images not displayed]

RADIATION DOSE REDUCTION: This exam was performed according to the
departmental dose-optimization program which includes automated
exposure control, adjustment of the mA and/or kV according to
patient size and/or use of iterative reconstruction technique.

CONTRAST:  75mL OMNIPAQUE IOHEXOL 350 MG/ML SOLN
FINDINGS: CTA NECK FINDINGS

Aortic arch: Unremarkable

Right carotid system: Minimal atheromatous plaque at the
bifurcation. No stenosis or ulceration.

Left carotid system: No detected atheromatous changes. No stenosis
or ulceration.

Vertebral arteries: No proximal subclavian stenosis. The vertebral
arteries are tortuous within the transverse foramina but smoothly
contoured and diffusely patent.

Skeleton: Generalized degenerative disc collapse and ridging.

Other neck: Negative

Upper chest: Negative

Review of the MIP images confirms the above findings

CTA HEAD FINDINGS

Anterior circulation: No major branch occlusion, beading, aneurysm,
or flow limiting stenosis. Atheromatous irregularity of medium size
vessels

Posterior circulation: Vertebral and basilar arteries are smoothly
contoured and diffusely patent. Moderate left P2/P3 segment
stenosis. Negative for aneurysm or generalized beading.

Venous sinuses: Diffusely patent

Anatomic variants: None significant

Review of the MIP images confirms the above findings

Subtle infarct on preceding brain MRI in the right cerebral
hemisphere, addendum in progress and findings communicated to
clinical service via [REDACTED] chat.
IMPRESSION: No emergent finding.

Atherosclerosis primarily affects intracranial branches, including
moderate narrowing at the left proximal PCA.

## 2021-10-11 MED ORDER — ONDANSETRON HCL 4 MG/2ML IJ SOLN: 4.0000 mg | Freq: Four times a day (QID) | INTRAMUSCULAR | Status: DC | PRN

## 2021-10-11 MED ORDER — IOHEXOL 350 MG/ML SOLN
75.0000 mL | Freq: Once | INTRAVENOUS | Status: AC | PRN
  Administered 2021-10-11: 75 mL via INTRAVENOUS

## 2021-10-11 MED ORDER — INSULIN ASPART 100 UNIT/ML IJ SOLN: 0.0000 [IU] | Freq: Every day | INTRAMUSCULAR | Status: DC

## 2021-10-11 MED ORDER — SIMVASTATIN 20 MG PO TABS
20.0000 mg | ORAL_TABLET | Freq: Every day | ORAL | Status: DC
  Administered 2021-10-11: 20 mg via ORAL
  Filled 2021-10-11: qty 1

## 2021-10-11 MED ORDER — INSULIN ASPART 100 UNIT/ML IJ SOLN
0.0000 [IU] | Freq: Three times a day (TID) | INTRAMUSCULAR | Status: DC
  Administered 2021-10-11 (×2): 2 [IU] via SUBCUTANEOUS
  Administered 2021-10-11 – 2021-10-12 (×2): 3 [IU] via SUBCUTANEOUS
  Filled 2021-10-11 (×3): qty 1

## 2021-10-11 MED ORDER — LACTATED RINGERS IV SOLN: INTRAVENOUS | Status: AC

## 2021-10-11 MED ORDER — ATORVASTATIN CALCIUM 20 MG PO TABS
40.0000 mg | ORAL_TABLET | Freq: Every day | ORAL | Status: DC
  Filled 2021-10-11: qty 2

## 2021-10-11 MED ORDER — ASPIRIN EC 81 MG PO TBEC
81.0000 mg | DELAYED_RELEASE_TABLET | Freq: Every day | ORAL | Status: DC
  Filled 2021-10-11: qty 1

## 2021-10-11 MED ORDER — POLYETHYLENE GLYCOL 3350 17 G PO PACK: 17.0000 g | PACK | Freq: Every day | ORAL | Status: DC | PRN

## 2021-10-11 MED ORDER — ENOXAPARIN SODIUM 60 MG/0.6ML IJ SOSY
0.5000 mg/kg | PREFILLED_SYRINGE | INTRAMUSCULAR | Status: DC
  Administered 2021-10-12: 57.5 mg via SUBCUTANEOUS
  Filled 2021-10-11: qty 0.6

## 2021-10-11 MED ORDER — ASPIRIN EC 81 MG PO TBEC
81.0000 mg | DELAYED_RELEASE_TABLET | Freq: Every day | ORAL | Status: DC
  Administered 2021-10-12: 81 mg via ORAL
  Filled 2021-10-11: qty 1

## 2021-10-11 MED ORDER — SODIUM CHLORIDE 0.9% FLUSH
3.0000 mL | Freq: Once | INTRAVENOUS | Status: AC
  Administered 2021-10-11: 3 mL via INTRAVENOUS

## 2021-10-11 MED ORDER — MELATONIN 5 MG PO TABS: 2.5000 mg | ORAL_TABLET | Freq: Every evening | ORAL | Status: DC | PRN

## 2021-10-11 MED ORDER — ACETAMINOPHEN 325 MG PO TABS: 650.0000 mg | ORAL_TABLET | Freq: Four times a day (QID) | ORAL | Status: DC | PRN

## 2021-10-11 MED ORDER — CLOPIDOGREL BISULFATE 75 MG PO TABS
75.0000 mg | ORAL_TABLET | Freq: Every day | ORAL | Status: DC
  Administered 2021-10-11 – 2021-10-12 (×2): 75 mg via ORAL
  Filled 2021-10-11 (×2): qty 1

## 2021-10-11 MED ORDER — CLOPIDOGREL BISULFATE 75 MG PO TABS
300.0000 mg | ORAL_TABLET | Freq: Once | ORAL | Status: AC
  Administered 2021-10-11: 300 mg via ORAL
  Filled 2021-10-11: qty 4

## 2021-10-11 MED ORDER — SODIUM CHLORIDE 0.9% FLUSH
3.0000 mL | Freq: Two times a day (BID) | INTRAVENOUS | Status: DC
  Administered 2021-10-11 – 2021-10-12 (×4): 3 mL via INTRAVENOUS

## 2021-10-11 MED ORDER — LABETALOL HCL 5 MG/ML IV SOLN: 5.0000 mg | INTRAVENOUS | Status: DC | PRN

## 2021-10-11 NOTE — ED Provider Notes (Addendum)
? ?[facility] ?Provider Note ? ? ? Event Date/Time  ? First MD Initiated Contact with Patient 10/11/21 0013   ?  (approximate) ? ? ?History  ? ?Extremity Weakness ? ? ?HPI ? ?Jeffery Beltran is a 81 y.o. male patient with history of obesity, hypertension, diabetes presents to the emergency department with his wife for concerns for possible stroke.  Patient states he was in his normal state of health and was going to bed at 11:30 PM when he tried to use the remote with his left hand.  States his left hand felt numb and weak and he could not use the arm.  Symptoms have resolved by the time he arrived to the emergency department.  No previous history of stroke or TIA.  Denies any headache or head injury.  Does take aspirin but no other antiplatelet or anticoagulant.  He denies any chest pain, shortness of breath.  No fevers, cough, vomiting or diarrhea.  Has never had similar symptoms. ? ? ?History provided by patient and wife. ? ? ? ?Past Medical History:  ?Diagnosis Date  ? Diabetes (HCC)   ? Glaucoma   ? Hypertension   ? ? ?Past Surgical History:  ?Procedure Laterality Date  ? CATARACT EXTRACTION Right   ? EYE SURGERY Right   ? Cat Sx  ? EYE SURGERY Right   ? Trab  ? IRIDOTOMY / IRIDECTOMY Right   ? Trab  ? TRABECULECTOMY Right   ? YAG LASER APPLICATION Right   ? ? ?MEDICATIONS:  ?Prior to Admission medications   ?Medication Sig Start Date End Date Taking? Authorizing Provider  ?ALPHAGAN P 0.15 % ophthalmic solution  02/17/20   [provider]  ?aspirin 81 MG chewable tablet Chew 81 mg by mouth daily. 05/04/18   [provider]  ?AZOPT 1 % ophthalmic suspension Place 1 drop into the left eye 2 (two) times daily.  12/03/16   [provider]  ?brimonidine (ALPHAGAN P) 0.1 % SOLN Apply 1 drop to eye 2 (two) times daily. 2 times daily in both eyes    [provider]  ?Bromfenac Sodium (PROLENSA) 0.07 % SOLN Place 1 drop into the left eye 4 (four) times  daily. ?Patient not taking: Reported on 05/30/2021 02/22/21   Rennis ChrisZamora, Brian, MD  ?Continuous Blood Gluc Receiver (DEXCOM G6 RECEIVER) [facility]  01/19/21   [provider]  ?Continuous Blood Gluc Sensor (DEXCOM G6 SENSOR) MISC SMARTSIG:1 Each Topical Every 10 Days 01/19/21   [provider]  ?Continuous Blood Gluc Transmit (DEXCOM G6 TRANSMITTER) MISC  01/19/21   [provider]  ?Dapagliflozin Propanediol (FARXIGA PO) Take 10 mg/day by mouth daily. ?Patient not taking: Reported on 05/30/2021    [provider]  ?dexamethasone (DECADRON) 0.1 % ophthalmic solution Place 1 drop into the left eye 4 (four) times daily. ?Patient not taking: Reported on 05/30/2021 12/20/20   [provider]  ?Catalina LungerHUMALOG KWIKPEN 100 UNIT/ML [facility]  11/27/16   [provider]  ?hydrochlorothiazide (HYDRODIURIL) 12.5 MG tablet Take by mouth.    [provider]  ?hydrochlorothiazide (MICROZIDE) 12.5 MG capsule  12/12/16   [provider]  ?Insulin Glargine 300 UNIT/ML SOPN Inject into the skin.    [provider]  ?latanoprost (XALATAN) 0.005 % ophthalmic solution Place 1 drop into both eyes at bedtime.  12/03/16   [provider]  ?Loratadine 10 MG CAPS Take by mouth.    [provider]  ?losartan (COZAAR) 100 MG tablet Take by  mouth.    [provider]  ?losartan (COZAAR) 100 MG tablet Take by mouth. ?Patient not taking: Reported on 03/21/2021 09/28/20   [provider]  ?Loteprednol Etabonate (LOTEMAX SM) 0.38 % GEL Place 1 drop into the left eye 4 (four) times daily. 02/22/21   Rennis Chris, MD  ?metFORMIN (GLUCOPHAGE-XR) 500 MG 24 hr tablet Take one tablet at supper for first week, then increase to two tablets at supper. 11/27/16   [provider]  ?nebivolol (BYSTOLIC) 5 MG tablet Take by mouth.    [provider]  ?Holli Humbles X 6 MM MISC  12/03/16   [provider]  ?ONE TOUCH ULTRA TEST test strip  12/03/16    [provider]  ?simvastatin (ZOCOR) 20 MG tablet  02/14/17   [provider]  ?simvastatin (ZOCOR) 20 MG tablet Take by mouth. ?Patient not taking: Reported on 03/21/2021 10/13/20   [provider]  ?triamterene-hydrochlorothiazide (MAXZIDE-25) 37.5-25 MG tablet Take 1 tablet by mouth daily. 02/14/20   [provider]  ? ? ?Physical Exam  ? ?Triage Vital Signs: ?ED Triage Vitals  ?Enc Vitals Group  ?   BP 10/11/21 0013 (!) 192/85  ?   Pulse Rate 10/11/21 0013 (!) 56  ?   Resp 10/11/21 0013 13  ?   Temp 10/11/21 0013 98 ?F (36.7 ?C)  ?   Temp Source 10/11/21 0013 Oral  ?   SpO2 10/11/21 0013 95 %  ?   Weight 10/11/21 0014 248 lb 10.9 oz (112.8 kg)  ?   Height --   ?   Head Circumference --   ?   Peak Flow --   ?   Pain Score 10/11/21 0010 0  ?   Pain Loc --   ?   Pain Edu? --   ?   Excl. in GC? --   ? ? ?Most recent vital signs: ?Vitals:  ? 10/11/21 0100 10/11/21 0115  ?BP:  (!) 160/69  ?Pulse: (!) 52 (!) 48  ?Resp: (!) 26 19  ?Temp:    ?SpO2: 96% 98%  ? ? ?CONSTITUTIONAL: Alert and oriented x 3 and responds appropriately to questions. Well-appearing; well-nourished, elderly, obese ?HEAD: Normocephalic, atraumatic ?EYES: Conjunctivae clear, pupils appear equal, sclera nonicteric ?ENT: normal nose; moist mucous membranes ?NECK: Supple, normal ROM ?CARD: RRR; S1 and S2 appreciated; no murmurs, no clicks, no rubs, no gallops ?RESP: Normal chest excursion without splinting or tachypnea; breath sounds clear and equal bilaterally; no wheezes, no rhonchi, no rales, no hypoxia or respiratory distress, speaking full sentences ?ABD/GI: Normal bowel sounds; non-distended; soft, non-tender, no rebound, no guarding, no peritoneal signs ?BACK: The back appears normal ?EXT: Normal ROM in all joints; no deformity noted, no edema; no cyanosis ?SKIN: Normal color for age and race; warm; no rash on exposed skin ?NEURO: Moves all extremities equally, normal speech, normal sensation diffusely, no pronator  drift, no dysmetria to finger-nose testing, normal gait from the wheelchair to the bed, cranial nerves II through XII intact, normal visual fields ?PSYCH: The patient's mood and manner are appropriate. ? ? ?ED Results / Procedures / Treatments  ? ?LABS: ?(all labs ordered are listed, but only abnormal results are displayed) ?Labs Reviewed  ?COMPREHENSIVE METABOLIC PANEL - Abnormal; Notable for the following components:  ?    Result Value  ? BUN 36 (*)   ? Creatinine, Ser 1.38 (*)   ? GFR, Estimated 52 (*)   ? All other components within normal limits  ?  CBG MONITORING, ED - Abnormal; Notable for the following components:  ? Glucose-Capillary 104 (*)   ? All other components within normal limits  ?TROPONIN I (HIGH SENSITIVITY) - Abnormal; Notable for the following components:  ? Troponin I (High Sensitivity) 28 (*)   ? All other components within normal limits  ?CBC  ?DIFFERENTIAL  ?ETHANOL  ?PROTIME-INR  ?APTT  ?URINALYSIS, ROUTINE W REFLEX MICROSCOPIC  ?URINE DRUG SCREEN, QUALITATIVE ([facility] ONLY)  ? ? ? ?EKG: ? ? ? Date: 10/11/2021 ? Rate: 58 ? Rhythm: normal sinus rhythm ? QRS Axis: normal ? Intervals: normal ? ST/T Wave abnormalities: normal ? Conduction Disutrbances: none ? Narrative Interpretation: Borderline T wave abnormality seen in lateral leads, no old for comparison ? ? ? ?RADIOLOGY: ?My personal review and interpretation of imaging: CT head shows no acute abnormality.  No intracranial hemorrhage or infarct seen. ? ?I have personally reviewed all radiology reports.   ?CT HEAD CODE STROKE WO CONTRAST ? ?Result Date: 10/11/2021 ?CLINICAL DATA:  Code stroke.  Sudden onset left arm weakness EXAM: CT HEAD WITHOUT CONTRAST TECHNIQUE: Contiguous axial images were obtained from the base of the skull through the vertex without intravenous contrast. RADIATION DOSE REDUCTION: This exam was performed according to the departmental dose-optimization program which includes automated exposure control, adjustment of the mA  and/or kV according to patient size and/or use of iterative reconstruction technique. COMPARISON:  None. FINDINGS: Brain: There is no mass, hemorrhage or extra-axial collection. There is generalized atrophy without

## 2021-10-11 NOTE — Evaluation (Signed)
Clinical/Bedside Swallow Evaluation ?Patient Details  ?Name: Jeffery Beltran ?MRN: [id number] ?Date of Birth: October 07, 1940 ? ?Today's Date: 10/11/2021 ?Time: SLP Start Time (ACUTE ONLY): 0900 SLP Stop Time (ACUTE ONLY): 0910 ?SLP Time Calculation (min) (ACUTE ONLY): 10 min ? ?Past Medical History:  ?Past Medical History:  ?Diagnosis Date  ? Diabetes (HCC)   ? Glaucoma   ? Hypertension   ? ?Past Surgical History:  ?Past Surgical History:  ?Procedure Laterality Date  ? CATARACT EXTRACTION Right   ? EYE SURGERY Right   ? Cat Sx  ? EYE SURGERY Right   ? Trab  ? IRIDOTOMY / IRIDECTOMY Right   ? Trab  ? TRABECULECTOMY Right   ? YAG LASER APPLICATION Right   ? ?HPI:  ?Per [employer]'s H&P "Jeffery Beltran is a 81 y.o. male with medical history significant for essential hypertension, hyperlipidemia, type 2 diabetes, obesity, who presented to [facility] ED from home via EMS due to acute onset of incoordination of his left hand.  He was in his usual state of health prior to this.  At 11:30 PM as he was attempting to use his TV remote control in the bedroom, his left hand felt weak and numb, he could not use it.  EMS was activated.  Patient was brought into the ED where he presented as a code stroke.  Seen by neurology/stroke team.  Had a CT head without contrast which was unremarkable.  Symptoms nearly resolved in the ED.  States his left hand is improved but is not completely back to baseline.  Stroke/neurology team recommended admission for TIA/stroke work-up.  Admitted by hospitalist service, TRH." MRI brain "1. No acute intracranial abnormality.  2. Findings of chronic small vessel ischemia and generalized volume  loss."  ?  ?Assessment / Plan / Recommendation  ?Clinical Impression ? Pt seen for clinical swallowing evaluation. Pt alert, pleasant, and cooperative. Speech is fluent, clear, and without s/sx dysarthria. No difficulty following commands during evaluation. Pt denies changes to speech/language/cognition or swallow  function. Wife and RN present at end of evaluation. ? ?Pt demonstrated an intact oral swallow. Pharyngeal swallow appeared [facility] per clinical assessment. To palpation, pt with seemingly timely swallow initiation and seemingly adequate laryngeal elevation. No change to vocal quality across trials. No overt or subtle s/sx pharyngeal dysphagia noted.  ? ?Recommend continuation of current diet with upright positioning for POs.  ? ?SLP to sign off as pt has no acute SLP needs. Pt, pt's wife, and RN made aware of results, recommendations, and SLP POC. Pt and wife verbalized understanding/agreement.  ? ?SLP Visit Diagnosis: Dysphagia, unspecified (R13.10) ?   ?Aspiration Risk ? No limitations  ?  ?Diet Recommendation Regular;Thin liquid  ? ?Medication Administration: Whole meds with liquid (as tolerated) ?Supervision: Patient able to self feed ?Postural Changes: Seated upright at 90 degrees  ?  ?Other  Recommendations Oral Care Recommendations: Oral care BID   ? ?Recommendations for follow up therapy are one component of a multi-disciplinary discharge planning process, led by the attending physician.  Recommendations may be updated based on patient status, additional functional criteria and insurance authorization. ? ?Follow up Recommendations No SLP follow up  ? ? ?  ?Assistance Recommended at Discharge  (defer to OT/PT)  ?Functional Status Assessment Patient has not had a recent decline in their functional status  ?   ? ?Prognosis Prognosis for Safe Diet Advancement: Good  ? ?  ? ?Swallow Study   ?General Date of Onset: 10/10/21 ?HPI: Per [facility]'s H&P "Jeffery Beltran  is a 81 y.o. male with medical history significant for essential hypertension, hyperlipidemia, type 2 diabetes, obesity, who presented to [facility] ED from home via EMS due to acute onset of incoordination of his left hand.  He was in his usual state of health prior to this.  At 11:30 PM as he was attempting to use his TV remote control in the bedroom, his  left hand felt weak and numb, he could not use it.  EMS was activated.  Patient was brought into the ED where he presented as a code stroke.  Seen by neurology/stroke team.  Had a CT head without contrast which was unremarkable.  Symptoms nearly resolved in the ED.  States his left hand is improved but is not completely back to baseline.  Stroke/neurology team recommended admission for TIA/stroke work-up.  Admitted by hospitalist service, TRH." MRI brain "1. No acute intracranial abnormality.  2. Findings of chronic small vessel ischemia and generalized volume  loss." ?Type of Study: Bedside Swallow Evaluation ?Previous Swallow Assessment: unknown ?Diet Prior to this Study: Regular;Thin liquids ?Temperature Spikes Noted: No ?Respiratory Status: Room air ?History of Recent Intubation: No ?Behavior/Cognition: Alert;Cooperative;Pleasant mood ?Oral Cavity Assessment: Within Functional Limits ?Oral Care Completed by SLP: Yes ?Oral Cavity - Dentition: Adequate natural dentition ?Vision: Functional for self-feeding ?Self-Feeding Abilities: Able to feed self ?Patient Positioning: Upright in bed (seated EOB) ?Baseline Vocal Quality: Normal ?Volitional Cough: Strong ?Volitional Swallow: Able to elicit  ?  ?Oral/Motor/Sensory Function Overall Oral Motor/Sensory Function: Within functional limits   ?Thin Liquid Thin Liquid: Within functional limits ?Presentation: Straw ?Other Comments: ~4 oz  ?  ?Solid ? ? ?  Solid: Within functional limits ?Presentation: Self Fed ?Other Comments: x2 graham crackers presented in halves  ? ?  ?Clyde Canterbury, M.S., CCC-SLP ?Speech-Language Pathologist ?[facility] ?[phone])  ? ?Woodroe Chen ?10/11/2021,11:03 AM ? ? ? ?

## 2021-10-11 NOTE — H&P (Addendum)
?History and Physical ? ?Jeffery Beltran [id number] DOB: January 09, 1941 DOA: 10/11/2021 ? ?Referring physician: Dr. Leonides Schanz  ?PCP: Derinda Late, MD  ?Outpatient Specialists: Endocrinology. ?Patient coming from: Home via EMS. ? ?Chief Complaint: Incoordination of left hand. ? ?HPI: Jeffery Beltran is a 81 y.o. male with medical history significant for essential hypertension, hyperlipidemia, type 2 diabetes, obesity, who presented to [facility] ED from home via EMS due to acute onset of incoordination of his left hand.  He was in his usual state of health prior to this.  At 11:30 PM as he was attempting to use his TV remote control in the bedroom, his left hand felt weak and numb, he could not use it.  EMS was activated.  Patient was brought into the ED where he presented as a code stroke.  Seen by neurology/stroke team.  Had a CT head without contrast which was unremarkable.  Symptoms nearly resolved in the ED.  States his left hand is improved but is not completely back to baseline.  Stroke/neurology team recommended admission for TIA/stroke work-up.  Admitted by hospitalist service, TRH. ? ?ED Course: Temperature 98.  BP 160/69, pulse 48, respiratory rate 19, with oxygen saturation 98% on room air.  Lab studies remarkable for serum creatinine 1.38 with GFR 52.  Troponin 28. ? ?Review of Systems: ?Review of systems as noted in the HPI. All other systems reviewed and are negative. ? ? ?Past Medical History:  ?Diagnosis Date  ? Diabetes ([place])   ? Glaucoma   ? Hypertension   ? ?Past Surgical History:  ?Procedure Laterality Date  ? CATARACT EXTRACTION Right   ? EYE SURGERY Right   ? Cat Sx  ? EYE SURGERY Right   ? Trab  ? IRIDOTOMY / IRIDECTOMY Right   ? Trab  ? TRABECULECTOMY Right   ? YAG LASER APPLICATION Right   ? ? ?Social History:  reports that he has never smoked. He has never used smokeless tobacco. He reports that he does not drink alcohol and does not use drugs. ? ? ?No Known Allergies ? ?Family History  ?Problem  Relation Age of Onset  ? Amblyopia Neg Hx   ? Blindness Neg Hx   ? Cataracts Neg Hx   ? Glaucoma Neg Hx   ? Macular degeneration Neg Hx   ? Retinal detachment Neg Hx   ? Strabismus Neg Hx   ? Retinitis pigmentosa Neg Hx   ?  ? ? ?Prior to Admission medications   ?Medication Sig Start Date End Date Taking? Authorizing Provider  ?ALPHAGAN P 0.15 % ophthalmic solution  02/17/20   [provider]  ?aspirin 81 MG chewable tablet Chew 81 mg by mouth daily. 05/04/18   [provider]  ?AZOPT 1 % ophthalmic suspension Place 1 drop into the left eye 2 (two) times daily.  12/03/16   [provider]  ?brimonidine (ALPHAGAN P) 0.1 % SOLN Apply 1 drop to eye 2 (two) times daily. 2 times daily in both eyes    [provider]  ?Bromfenac Sodium (PROLENSA) 0.07 % SOLN Place 1 drop into the left eye 4 (four) times daily. ?Patient not taking: Reported on 05/30/2021 02/22/21   [name], MD  ?Continuous Blood Gluc Receiver ([place]) [facility]  01/19/21   [provider]  ?Continuous Blood Gluc Sensor (DEXCOM G6 SENSOR) MISC SMARTSIG:1 Each Topical Every 10 Days 01/19/21   [provider]  ?Continuous Blood Gluc Transmit (DEXCOM G6 TRANSMITTER) [place]  01/19/21   [provider]  ?Dapagliflozin Propanediol (FARXIGA PO) Take 10 mg/day by mouth daily. ?Patient not taking: Reported on 05/30/2021    [provider]  ?dexamethasone (DECADRON) 0.1 % ophthalmic solution Place 1 drop into the left eye 4 (four) times daily. ?Patient not taking: Reported on 05/30/2021 12/20/20   [provider]  ?Cleda Clarks 100 UNIT/ML [facility]  11/27/16   [provider]  ?hydrochlorothiazide (HYDRODIURIL) 12.5 MG tablet Take by mouth.    [provider]  ?hydrochlorothiazide (MICROZIDE) 12.5 MG capsule  12/12/16   [provider]  ?Insulin Glargine 300 UNIT/ML SOPN Inject into the skin.    [provider]  ?latanoprost (XALATAN) 0.005 %  ophthalmic solution Place 1 drop into both eyes at bedtime.  12/03/16   [provider]  ?Loratadine 10 MG CAPS Take by mouth.    [provider]  ?losartan (COZAAR) 100 MG tablet Take by mouth.    [provider]  ?losartan (COZAAR) 100 MG tablet Take by mouth. ?Patient not taking: Reported on 03/21/2021 09/28/20   [provider]  ?Loteprednol Etabonate (LOTEMAX SM) 0.38 % GEL Place 1 drop into the left eye 4 (four) times daily. 02/22/21   [name], MD  ?metFORMIN (GLUCOPHAGE-XR) 500 MG 24 hr tablet Take one tablet at supper for first week, then increase to two tablets at supper. 11/27/16   [provider]  ?nebivolol (BYSTOLIC) 5 MG tablet Take by mouth.    [provider]  ?Sherron Flemings X 6 MM MISC  12/03/16   [provider]  ?ONE TOUCH ULTRA TEST test strip  12/03/16   [provider]  ?simvastatin (ZOCOR) 20 MG tablet  02/14/17   [provider]  ?simvastatin (ZOCOR) 20 MG tablet Take by mouth. ?Patient not taking: Reported on 03/21/2021 10/13/20   [provider]  ?triamterene-hydrochlorothiazide (MAXZIDE-25) 37.5-25 MG tablet Take 1 tablet by mouth daily. 02/14/20   [provider]  ? ? ?Physical Exam: ?BP (!) 160/69   Pulse (!) 48   Temp 98 ?F (36.7 ?C) (Oral)   Resp 19   Ht 5\' 10"  (1.778 m)   Wt 112.8 kg   SpO2 98%   BMI 35.68 kg/m?  ? ?General: 81 y.o. year-old male well developed well nourished in no acute distress.  Alert and oriented x3. ?Cardiovascular: Regular rate and rhythm with no rubs or gallops.  No thyromegaly or JVD noted.  No lower extremity edema. 2/4 pulses in all 4 extremities. ?Respiratory: Clear to auscultation with no wheezes or rales. Good inspiratory effort. ?Abdomen: Soft nontender nondistended with normal bowel sounds x4 quadrants. ?Muskuloskeletal: No cyanosis, clubbing or edema noted bilaterally ?Neuro: CN II-XII intact, strength, sensation, reflexes ?Skin: No ulcerative lesions  noted or rashes ?Psychiatry: Judgement and insight appear normal. Mood is appropriate for condition and setting ?   ?   ?   ?Labs on Admission:  ?Basic Metabolic Panel: ?Recent Labs  ?Lab 10/11/21 ?0016  ?NA 137  ?K 3.7  ?CL 101  ?CO2 26  ?GLUCOSE 99  ?BUN 36*  ?CREATININE 1.38*  ?CALCIUM 9.4  ? ?Liver Function Tests: ?Recent Labs  ?Lab 10/11/21 ?0016  ?AST 18  ?ALT 13  ?ALKPHOS 73  ?BILITOT 0.6  ?PROT 7.5  ?ALBUMIN 3.9  ? ?No results for input(s): LIPASE, AMYLASE in the last 168 hours. ?No results for input(s): AMMONIA in the last 168 hours. ?CBC: ?Recent Labs  ?Lab 10/11/21 ?0016  ?WBC 9.5  ?NEUTROABS 5.7  ?HGB 13.1  ?HCT  41.4  ?MCV 88.1  ?PLT 245  ? ?Cardiac Enzymes: ?No results for input(s): CKTOTAL, CKMB, CKMBINDEX, TROPONINI in the last 168 hours. ? ?BNP (last 3 results) ?No results for input(s): BNP in the last 8760 hours. ? ?ProBNP (last 3 results) ?No results for input(s): PROBNP in the last 8760 hours. ? ?CBG: ?Recent Labs  ?Lab 10/11/21 ?0001  ?GLUCAP 104*  ? ? ?Radiological Exams on Admission: ?CT HEAD CODE STROKE WO CONTRAST ? ?Result Date: 10/11/2021 ?CLINICAL DATA:  Code stroke.  Sudden onset left arm weakness EXAM: CT HEAD WITHOUT CONTRAST TECHNIQUE: Contiguous axial images were obtained from the base of the skull through the vertex without intravenous contrast. RADIATION DOSE REDUCTION: This exam was performed according to the departmental dose-optimization program which includes automated exposure control, adjustment of the mA and/or kV according to patient size and/or use of iterative reconstruction technique. COMPARISON:  None. FINDINGS: Brain: There is no mass, hemorrhage or extra-axial collection. There is generalized atrophy without lobar predilection. There is hypoattenuation of the periventricular white matter, most commonly indicating chronic ischemic microangiopathy. Vascular: No abnormal hyperdensity of the major intracranial arteries or dural venous sinuses. No intracranial  atherosclerosis. Skull: The visualized skull base, calvarium and extracranial soft tissues are normal. Sinuses/Orbits: No fluid levels or advanced mucosal thickening of the visualized paranasal sinuses. No mastoid or middle ear effusi

## 2021-10-11 NOTE — ED Notes (Signed)
Informed RN bed assigned 

## 2021-10-11 NOTE — Progress Notes (Signed)
?PROGRESS NOTE ? ? ? ?Jeffery Beltran  [id number] DOB: September 11, 1940 DOA: 10/11/2021 ?PCP: Kandyce Rud, MD  ? ?Assessment & Plan: ?  ?Principal Problem: ?  TIA (transient ischemic attack) ? ? ?CVA: as per MRI. Continue on aspirin, plavix, statin. Continue w/ neuro checks. Echo ordered. Neuro consulted ? ?Elevated troponin: minimally elevated, likely secondary to demand ischemia  ?  ?Sinus bradycardia: hold home nebivolol  ?  ?AKI: continue on IVFs. Avoid nephrotoxic meds  ?  ?HTN: allow permissive HTN. Hold nebivolol, HCTZ ?  ?DM2: HbA1c is pending. Continue on SSI w/ accuchecks ? ?Obesity: BMI 35.6. Complicates overall care & prognosis  ? ? ?DVT prophylaxis: lovenox  ?Code Status: full  ?Family Communication: discussed pt's care w/ pt's family at bedside and answered their questions  ?Disposition Plan: depends on PT/OT recs  ? ?Level of care: Telemetry Medical ? ?Status is: Inpatient ?Remains inpatient appropriate because: CVA ? ? ? ? ?Consultants:  ?Neuro  ? ?Procedures:  ? ?Antimicrobials:  ? ? ?Subjective: ?Pt c/o malaise  ? ?Objective: ?Vitals:  ? 10/11/21 0500 10/11/21 0600 10/11/21 0626 10/11/21 0700  ?BP: (!) 151/69 (!) 141/66  (!) 146/66  ?Pulse: (!) 47 (!) 44  (!) 46  ?Resp: 17 17  17   ?Temp:   98.1 ?F (36.7 ?C)   ?TempSrc:   Oral   ?SpO2: 96% 94%  96%  ?Weight:      ?Height:      ? ?No intake or output data in the 24 hours ending 10/11/21 0819 ?Filed Weights  ? 10/11/21 0014  ?Weight: 112.8 kg  ? ? ?Examination: ? ?General exam: Appears calm and comfortable  ?Respiratory system: Clear to auscultation. Respiratory effort normal. ?Cardiovascular system: S1 & S2+. No  rubs, gallops or clicks.  ?Gastrointestinal system: Abdomen is nondistended, soft and nontender. No organomegaly or masses felt. Normal bowel sounds heard. ?Central nervous system: Alert and oriented. Moves all extremities . ?Psychiatry: Judgement and insight appear normal. Mood & affect appropriate.  ? ? ? ?Data Reviewed: I have personally  reviewed following labs and imaging studies ? ?CBC: ?Recent Labs  ?Lab 10/11/21 ?12/11/21 10/11/21 ?12/11/21  ?WBC 9.5 8.4  ?NEUTROABS 5.7  --   ?HGB 13.1 12.1*  ?HCT 41.4 38.8*  ?MCV 88.1 89.2  ?PLT 245 203  ? ?Basic Metabolic Panel: ?Recent Labs  ?Lab 10/11/21 ?0016  ?NA 137  ?K 3.7  ?CL 101  ?CO2 26  ?GLUCOSE 99  ?BUN 36*  ?CREATININE 1.38*  ?CALCIUM 9.4  ? ?GFR: ?Estimated Creatinine Clearance: 54.6 mL/min (A) (by C-G formula based on SCr of 1.38 mg/dL (H)). ?Liver Function Tests: ?Recent Labs  ?Lab 10/11/21 ?0016  ?AST 18  ?ALT 13  ?ALKPHOS 73  ?BILITOT 0.6  ?PROT 7.5  ?ALBUMIN 3.9  ? ?No results for input(s): LIPASE, AMYLASE in the last 168 hours. ?No results for input(s): AMMONIA in the last 168 hours. ?Coagulation Profile: ?Recent Labs  ?Lab 10/11/21 ?0100  ?INR 1.2  ? ?Cardiac Enzymes: ?No results for input(s): CKTOTAL, CKMB, CKMBINDEX, TROPONINI in the last 168 hours. ?BNP (last 3 results) ?No results for input(s): PROBNP in the last 8760 hours. ?HbA1C: ?No results for input(s): HGBA1C in the last 72 hours. ?CBG: ?Recent Labs  ?Lab 10/11/21 ?0001 10/11/21 ?0208 10/11/21 ?12/11/21 10/11/21 ?12/11/21  ?GLUCAP 104* 60* 108* 126*  ? ?Lipid Profile: ?Recent Labs  ?  10/11/21 ?0249  ?CHOL 137  ?HDL 41  ?LDLCALC 81  ?TRIG 75  ?CHOLHDL 3.3  ? ?Thyroid Function Tests: ?  Recent Labs  ?  10/11/21 ?0016  ?TSH 3.119  ? ?Anemia Panel: ?No results for input(s): VITAMINB12, FOLATE, FERRITIN, TIBC, IRON, RETICCTPCT in the last 72 hours. ?Sepsis Labs: ?No results for input(s): PROCALCITON, LATICACIDVEN in the last 168 hours. ? ?No results found for this or any previous visit (from the past 240 hour(s)).  ? ? ? ? ? ?Radiology Studies: ?CT ANGIO HEAD NECK W WO CM ? ?Result Date: 10/11/2021 ?CLINICAL DATA:  Stroke workup.  Left arm weakness EXAM: CT ANGIOGRAPHY HEAD AND NECK TECHNIQUE: Multidetector CT imaging of the head and neck was performed using the standard protocol during bolus administration of intravenous contrast. Multiplanar CT image  reconstructions and MIPs were obtained to evaluate the vascular anatomy. Carotid stenosis measurements (when applicable) are obtained utilizing NASCET criteria, using the distal internal carotid diameter as the denominator. RADIATION DOSE REDUCTION: This exam was performed according to the departmental dose-optimization program which includes automated exposure control, adjustment of the mA and/or kV according to patient size and/or use of iterative reconstruction technique. CONTRAST:  75mL OMNIPAQUE IOHEXOL 350 MG/ML SOLN COMPARISON:  CT and MRI of the brain from earlier today FINDINGS: CTA NECK FINDINGS Aortic arch: Unremarkable Right carotid system: Minimal atheromatous plaque at the bifurcation. No stenosis or ulceration. Left carotid system: No detected atheromatous changes. No stenosis or ulceration. Vertebral arteries: No proximal subclavian stenosis. The vertebral arteries are tortuous within the transverse foramina but smoothly contoured and diffusely patent. Skeleton: Generalized degenerative disc collapse and ridging. Other neck: Negative Upper chest: Negative Review of the MIP images confirms the above findings CTA HEAD FINDINGS Anterior circulation: No major branch occlusion, beading, aneurysm, or flow limiting stenosis. Atheromatous irregularity of medium size vessels Posterior circulation: Vertebral and basilar arteries are smoothly contoured and diffusely patent. Moderate left P2/P3 segment stenosis. Negative for aneurysm or generalized beading. Venous sinuses: Diffusely patent Anatomic variants: None significant Review of the MIP images confirms the above findings Subtle infarct on preceding brain MRI in the right cerebral hemisphere, addendum in progress and findings communicated to clinical service via epic chat. IMPRESSION: No emergent finding. Atherosclerosis primarily affects intracranial branches, including moderate narrowing at the left proximal PCA. Electronically Signed   By: Tiburcio PeaJonathan   Watts M.D.   On: 10/11/2021 04:49  ? ?MR BRAIN WO CONTRAST ? ?Addendum Date: 10/11/2021   ?ADDENDUM REPORT: 10/11/2021 04:52 ADDENDUM: Case reviewed for CTA and there is a small acute right frontal cortex infarct anteriorly on 5:34. There is subtle DWI hyperintensity in the right perirolandic cortex on 5:36, which may relate to the arm symptoms. Electronically Signed   By: Tiburcio PeaJonathan  Watts M.D.   On: 10/11/2021 04:52  ? ?Result Date: 10/11/2021 ?CLINICAL DATA:  Acute neurologic deficit EXAM: MRI HEAD WITHOUT CONTRAST TECHNIQUE: Multiplanar, multiecho pulse sequences of the brain and surrounding structures were obtained without intravenous contrast. COMPARISON:  None. FINDINGS: Brain: No acute infarct, mass effect or extra-axial collection. No acute or chronic hemorrhage. There is multifocal hyperintense T2-weighted signal within the white matter. Generalized volume loss without a clear lobar predilection. The midline structures are normal. Vascular: Major flow voids are preserved. Skull and upper cervical spine: Normal calvarium and skull base. Visualized upper cervical spine and soft tissues are normal. Sinuses/Orbits:No paranasal sinus fluid levels or advanced mucosal thickening. No mastoid or middle ear effusion. Normal orbits. IMPRESSION: 1. No acute intracranial abnormality. 2. Findings of chronic small vessel ischemia and generalized volume loss. Electronically Signed: By: Deatra RobinsonKevin  Herman M.D. On: 10/11/2021 02:04  ? ?CT  HEAD CODE STROKE WO CONTRAST ? ?Result Date: 10/11/2021 ?CLINICAL DATA:  Code stroke.  Sudden onset left arm weakness EXAM: CT HEAD WITHOUT CONTRAST TECHNIQUE: Contiguous axial images were obtained from the base of the skull through the vertex without intravenous contrast. RADIATION DOSE REDUCTION: This exam was performed according to the departmental dose-optimization program which includes automated exposure control, adjustment of the mA and/or kV according to patient size and/or use of iterative  reconstruction technique. COMPARISON:  None. FINDINGS: Brain: There is no mass, hemorrhage or extra-axial collection. There is generalized atrophy without lobar predilection. There is hypoattenuation of the

## 2021-10-11 NOTE — ED Notes (Signed)
Neurologist on tele-neuro cart at this time.  ?

## 2021-10-11 NOTE — Progress Notes (Signed)
Admission profile updated. ?

## 2021-10-11 NOTE — ED Notes (Signed)
Pt assisted into w/c and taken to be cleared by ED provider enroute to CT by Solon Augusta RN  ?

## 2021-10-11 NOTE — ED Notes (Signed)
Hospital provider at bedside

## 2021-10-11 NOTE — ED Triage Notes (Signed)
Pt to ED from home c/o left arm weakness that started at 2330 tonight.  States was trying to lift tv remote and had trouble. ?

## 2021-10-11 NOTE — ED Notes (Signed)
Patient transported to MRI 

## 2021-10-11 NOTE — ED Notes (Signed)
This tech stuck pt two times attempting to get a green top for lab. It was unsuccessful. RN notified  ?

## 2021-10-11 NOTE — ED Notes (Signed)
Tele-neuro complete at this time.  ?

## 2021-10-11 NOTE — Progress Notes (Signed)
Chaplain responded to Code Stroke. Chaplain visited with patient and wife Jeffery Beltran. Chaplain provided compassionate presence and reflective listening. Patient and family appreciated Chaplain visit. ?

## 2021-10-11 NOTE — Consult Note (Signed)
[facility] ?[facility] Tele[facility]urology Consult Services ? ? ?Patient Name:   Jeffery Beltran, Jeffery Beltran ?Date of Birth:   1941/04/10 ?Identification Number:   MRN - [id number] ?Date of Service:   10/11/2021 00:08:13 ? ?Diagnosis: ?      R27.0 - Ataxia (unsp) ? ?Impression: ?     81 y/o man with acute onset of incoordination in the left hand, on exam has very mild ataxia and sensory loss with no weakness, concerning for small stroke. Given minimal deficits affecting nondominant hand, not a thrombolytic candidate. Would recommend admission for stroke work-up. ? ?Our recommendations are outlined below. ? ?Recommendations: ? ?      Stroke/Telemetry Floor ?      Neuro Checks ?      Bedside Swallow Eval ?      DVT Prophylaxis ?      IV Fluids, Normal Saline ?      Head of Bed 30 Degrees ?      Euglycemia and Avoid Hyperthermia (PRN Acetaminophen) ?      Bolus with Clopidogrel 300 mg bolus x1 and initiate dual antiplatelet therapy with Aspirin 81 mg daily and Clopidogrel 75 mg daily ?      Antihypertensives PRN if Blood pressure is greater than 220/120 or there is a concern for End organ damage/contraindications for permissive HTN. If blood pressure is greater than 220/120 give labetalol PO or IV or Vasotec IV with a goal of 15% reduction in BP during the first 24 hours. ? ?Per facility request will defer further work up, management, and referrals to inpatient service, inclusive of inpatient neurology consult. ? ?Sign Out: ?      Discussed with Emergency Department Provider ? ? ? ?------------------------------------------------------------------------------ ? ?Advanced Imaging: ?Advanced Imaging Not Completed because: ? ?clinical presentation not suggestive of LVO ? ? ?Metrics: ?Last Known Well: 10/10/2021 23:30:00 ?[facility] Notification Time: 10/11/2021 00:08:13 ?Arrival Time: 10/10/2021 23:58:00 ?Stamp Time: 10/11/2021 00:08:13 ?Initial Response Time: 10/11/2021 00:19:16 ?Symptoms: LUE incoordination. ?NIHSS Start  Assessment Time: 10/11/2021 00:30:49 ?Patient is not a candidate for Thrombolytic. ?Thrombolytic Medical Decision: 10/11/2021 00:38:07 ?Patient was not deemed candidate for Thrombolytic because of following reasons: ?No disabling symptoms. ? ?CT head showed no acute hemorrhage or acute core infarct. ? ?ED Physician notified of diagnostic impression and management plan on 10/11/2021 00:48:07 ? ? ? ?------------------------------------------------------------------------------ ? ?History of Present Illness: ?Patient is a 81 year old Male. ? ?Patient was brought by private transportation with symptoms of LUE incoordination. ?Patient had just gotten into bed around 23:30 and was reaching for the remote with his left hand and felt like his grip was weak, did not notice any numbness or any other symptoms at that time. Still feels like it's harder to control his left hand, and feeling a little lightheaded. Reports he was having some neck pain earlier today when he finished his swim but this has resolved. He is right-handed. ?  ? ?Past Medical History: ?     Hypertension ?     Diabetes Mellitus ? ?Medications: ? ?No Anticoagulant use  ?Antiplatelet use: Yes ASA ?Reviewed EMR for current medications ? ?Allergies:  ?Reviewed ? ?Social History: ?Smoking: No ? ?Family History: ? ?There Is Family History [id number] pertinent ? ?ROS : ?14 Points Review of Systems was performed and was negative except mentioned in HPI. ? ?Past Surgical History: ?There Is No Surgical History Contributory To Today?s Visit ? ?  ? ?Examination: ?BP(192/85), Pulse(57), ?1A: Level of Consciousness - Alert; keenly responsive + 0 ?1B: Ask Month and Age -  Both Questions Right + 0 ?1C: Blink Eyes & Squeeze Hands - Performs Both Tasks + 0 ?2: Test Horizontal Extraocular Movements - Normal + 0 ?3: Test Visual Fields - No Visual Loss + 0 ?4: Test Facial Palsy (Use Grimace if Obtunded) - Normal symmetry + 0 ?5A: Test Left Arm Motor Drift - No Drift for 10 Seconds  + 0 ?5B: Test Right Arm Motor Drift - No Drift for 10 Seconds + 0 ?6A: Test Left Leg Motor Drift - No Drift for 5 Seconds + 0 ?6B: Test Right Leg Motor Drift - No Drift for 5 Seconds + 0 ?7: Test Limb Ataxia (FNF/Heel-Shin) - Ataxia in 1 Limb + 1 ?8: Test Sensation - Mild-Moderate Loss: Less Sharp/More Dull + 1 ?9: Test Language/Aphasia - Normal; No aphasia + 0 ?10: Test Dysarthria - Normal + 0 ?11: Test Extinction/Inattention - No abnormality + 0 ? ?NIHSS Score: 2 ? ?NIHSS Free Text : very mild ataxia with FNF on the left; grips equal per RN ? ?Pre-Morbid Modified Rankin Scale: ?0 Points = No symptoms at all ? ? ?Patient/Family was informed the Neurology Consult would occur via TeleHealth consult by way of interactive audio and video telecommunications and consented to receiving care in this manner. ? ? ?Patient is being evaluated for possible acute neurologic impairment and high probability of imminent or life-threatening deterioration. I spent total of 22 minutes providing care to this patient, including time for face to face visit via telemedicine, review of medical records, imaging studies and discussion of findings with providers, the patient and/or family. ? ? ?Dr Eleonore Chiquito ? ? ?[facility] ?[phone] ? ? ?Case [id number] ?  ?

## 2021-10-11 NOTE — Consult Note (Signed)
NEUROLOGY CONSULTATION NOTE  ? ?Date of service: October 11, 2021 ?Patient Name: Jeffery Beltran ?MRN:  [id number] ?DOB:  01-Jan-1941 ?Reason for consult: "L hand incoordination" ?Requesting Provider: Wyvonnia Dusky, MD ?_ _ _   _ __   _ __ _ _  __ __   _ __   __ _ ? ?History of Present Illness  ?Jeffery Beltran is a 81 y.o. male with PMH significant for DM2, HN, glaucoma who had sudden onset L hand incoordination. Was trying to change the TC channel and noted difficulty with using TV remote. Started suddenly at 2330. Came in to the ED where symptoms were improving and had very mild residual incoordination around 0300. ? ?No prior stroke, does not smoke, reports last HbA1c was less than 7. Takes Zocor, had trouble with liver in the past with atorvastatin. ? ?Workup with MRI Brain shows subtle cortical diffusion restriction in the R perirolandic sulcus. ? ?LKW: 2330 on 10/10/21 ?mRS: 0 ?tNKASE: not offered, symptoms too mild to treat ?Thrombectomy: not offered, too mild to treat. ?NIHSS components Score: Comment  ?1a Level of Conscious 0[x]  1[]  2[]  3[]      ?1b LOC Questions 0[x]  1[]  2[]       ?1c LOC Commands 0[x]  1[]  2[]       ?2 Best Gaze 0[x]  1[]  2[]       ?3 Visual 0[x]  1[]  2[]  3[]      ?4 Facial Palsy 0[x]  1[]  2[]  3[]      ?5a Motor Arm - left 0[x]  1[]  2[]  3[]  4[]  UN[]    ?5b Motor Arm - Right 0[x]  1[]  2[]  3[]  4[]  UN[]    ?6a Motor Leg - Left 0[x]  1[]  2[]  3[]  4[]  UN[]    ?6b Motor Leg - Right 0[x]  1[]  2[]  3[]  4[]  UN[]    ?7 Limb Ataxia 0[x]  1[]  2[]  3[]  UN[]     ?8 Sensory 0[x]  1[]  2[]  UN[]      ?9 Best Language 0[x]  1[]  2[]  3[]      ?10 Dysarthria 0[x]  1[]  2[]  UN[]      ?11 Extinct. and Inattention 0[x]  1[]  2[]       ?TOTAL: 0   ? ? ?  ?ROS  ? ?Constitutional Denies weight loss, fever and chills.   ?HEENT Denies changes in vision and hearing.   ?Respiratory Denies SOB and cough.   ?CV Denies palpitations and CP   ?GI Denies abdominal pain, nausea, vomiting and diarrhea.   ?GU Denies dysuria and urinary frequency.   ?MSK  Denies myalgia and joint pain.   ?Skin Denies rash and pruritus.   ?Neurological Denies headache and syncope.   ?Psychiatric Denies recent changes in mood. Denies anxiety and depression.   ? ?Past History  ? ?Past Medical History:  ?Diagnosis Date  ? Diabetes ([place])   ? Glaucoma   ? Hypertension   ? ?Past Surgical History:  ?Procedure Laterality Date  ? CATARACT EXTRACTION Right   ? EYE SURGERY Right   ? Cat Sx  ? EYE SURGERY Right   ? Trab  ? IRIDOTOMY / IRIDECTOMY Right   ? Trab  ? TRABECULECTOMY Right   ? YAG LASER APPLICATION Right   ? ?Family History  ?Problem Relation Age of Onset  ? Amblyopia Neg Hx   ? Blindness Neg Hx   ? Cataracts Neg Hx   ? Glaucoma Neg Hx   ? Macular degeneration Neg Hx   ? Retinal detachment Neg Hx   ? Strabismus Neg Hx   ? Retinitis pigmentosa Neg Hx   ? ?Social History  ? ?  Socioeconomic History  ? Marital status: Married  ?  Spouse name: Not on file  ? Number of children: Not on file  ? Years of education: Not on file  ? Highest education level: Not on file  ?Occupational History  ? Not on file  ?Tobacco Use  ? Smoking status: Never  ? Smokeless tobacco: Never  ?Vaping Use  ? Vaping Use: Never used  ?Substance and Sexual Activity  ? Alcohol use: Never  ? Drug use: Never  ? Sexual activity: Not on file  ?Other Topics Concern  ? Not on file  ?Social History Narrative  ? Not on file  ? ?Social Determinants of Health  ? ?Financial Resource Strain: Not on file  ?Food Insecurity: Not on file  ?Transportation Needs: Not on file  ?Physical Activity: Not on file  ?Stress: Not on file  ?Social Connections: Not on file  ? ?No Known Allergies ? ?Medications  ?(Not in a hospital admission) ?  ? ?Vitals  ? ?Vitals:  ? 10/11/21 0600 10/11/21 0626 10/11/21 0700 10/11/21 0800  ?BP: (!) 141/66  (!) 146/66 (!) 156/71  ?Pulse: (!) 44  (!) 46 (!) 52  ?Resp: 17  17 18   ?Temp:  98.1 ?F (36.7 ?C)    ?TempSrc:  Oral    ?SpO2: 94%  96% 97%  ?Weight:      ?Height:      ?  ? ?Body mass index is 35.68  kg/m?. ? ?Physical Exam  ? ?General: Laying comfortably in bed; in no acute distress.  ?HENT: Normal oropharynx and mucosa. Normal external appearance of ears and nose.  ?Neck: Supple, no pain or tenderness  ?CV: No JVD. No peripheral edema.  ?Pulmonary: Symmetric Chest rise. Normal respiratory effort.  ?Abdomen: Soft to touch, non-tender.  ?Ext: No cyanosis, edema, or deformity  ?Skin: No rash. Normal palpation of skin.   ?Musculoskeletal: Normal digits and nails by inspection. No clubbing.  ? ?Neurologic Examination  ?Mental status/Cognition: Alert, oriented to self, place, month and year, good attention.  ?Speech/language: Fluent, comprehension intact, object naming intact, repetition intact.  ?Cranial nerves:  ? CN II Pupils equal and reactive to light, no VF deficits   ? CN III,IV,VI EOM intact, no gaze preference or deviation, no nystagmus   ? CN V normal sensation in V1, V2, and V3 segments bilaterally   ? CN VII no asymmetry, no nasolabial fold flattening   ? CN VIII normal hearing to speech   ? CN IX & X normal palatal elevation, no uvular deviation   ? CN XI 5/5 head turn and 5/5 shoulder shrug bilaterally   ? CN XII midline tongue protrusion   ? ?Motor:  ?Muscle bulk: normal, tone normal, pronator drift: mild LUE pronator drift. tremor none ?Mvmt Root Nerve  Muscle Right Left Comments  ?SA C5/6 Ax Deltoid 5 5   ?EF C5/6 Mc Biceps 5 5   ?EE C6/7/8 Rad Triceps 5 5   ?WF C6/7 Med FCR     ?WE C7/8 PIN ECU     ?F Ab C8/T1 U ADM/FDI 5 5   ?HF L1/2/3 Fem Illopsoas 5 5   ?KE L2/3/4 Fem Quad 5 5   ?DF L4/5 D Peron Tib Ant 5 5   ?PF S1/2 Tibial Grc/Sol 5 5   ? ?Reflexes: ? Right Left Comments  ?Pectoralis     ? Biceps (C5/6) 2 2   ?Brachioradialis (C5/6) 2 2   ? Triceps (C6/7) 2 2   ? Patellar (  L3/4) 2 2   ? Achilles (S1)     ? Hoffman     ? Plantar     ?Jaw jerk   ? ?Sensation: ? Light touch Intact BL  ? Pin prick   ? Temperature   ? Vibration   ?Proprioception   ? ?Coordination/Complex Motor:  ?- Finger to Nose  intact BL ?- Heel to shin are intact BL ?- Rapid alternating movement are normal ?- Gait: deferred. ? ?Labs  ? ?CBC:  ?Recent Labs  ?Lab 10/11/21 ?[id number] 10/11/21 ?[id number]  ?WBC 9.5 8.4  ?NEUTROABS 5.7  --   ?HGB 13.1 12.1*  ?HCT 41.4 38.8*  ?MCV 88.1 89.2  ?PLT 245 203  ? ? ?Basic Metabolic Panel:  ?Lab Results  ?Component Value Date  ? NA 137 10/11/2021  ? K 3.7 10/11/2021  ? CO2 26 10/11/2021  ? GLUCOSE 99 10/11/2021  ? BUN 36 (H) 10/11/2021  ? CREATININE 1.38 (H) 10/11/2021  ? CALCIUM 9.4 10/11/2021  ? GFRNONAA 52 (L) 10/11/2021  ? ?Lipid Panel:  ?Lab Results  ?Component Value Date  ? Penelope 81 10/11/2021  ? ?HgbA1c: No results found for: HGBA1C ?Urine Drug Screen:  ?   ?Component Value Date/Time  ? LABOPIA NONE DETECTED 10/11/2021 0016  ? COCAINSCRNUR NONE DETECTED 10/11/2021 0016  ? LABBENZ NONE DETECTED 10/11/2021 0016  ? AMPHETMU NONE DETECTED 10/11/2021 0016  ? THCU NONE DETECTED 10/11/2021 0016  ? LABBARB NONE DETECTED 10/11/2021 0016  ?  ?Alcohol Level  ?   ?Component Value Date/Time  ? ETH <10 10/11/2021 0016  ? ? ?CT Head without contrast(Personally reviewed): ?CTH was negative for a large hypodensity concerning for a large territory infarct or hyperdensity concerning for an ICH ? ?CT angio Head and Neck with contrast(Personally reviewed): ?No LVO ? ?MRI Brain(Personally reviewed): ?Subtle DWI changes in the R perirolandic sulcus. ? ?Impression  ? ?Jeffery Beltran is a 81 y.o. male with PMH significant for DM2, HN, glaucoma who had sudden onset L hand incoordination. Found to have a cortical stroke in the R peri-rolandic sulcus. His neurologic examination is notable for mild LUE pronator drift. The location and appearance of the stroke seems to be most suggestive of an embolic stroke rather than a small vessel stroke. ? ?Primary Diagnosis:  ?Cerebral infarction due to embolism of  right middle cerebral artery.  ? ?Secondary Diagnosis: ?Essential (primary) hypertension and Type 2 diabetes mellitus w/o  complications ? ?Recommendations  ?Plan: ? ?- Frequent Neuro checks per stroke unit protocol ?- Recommend obtaining TTE ?- LDL 81.  ?- Continue home Zocor 20mg  daily. Reports having transminitis in the past when he took hig

## 2021-10-11 NOTE — ED Notes (Signed)
Pt ambulatory to STAT desk accomp by wife; reports at 2330 had sudden onset weakness to left arm/hand; left arm hand grip weak; charge nurse notified and CT called for imaging ?

## 2021-10-11 NOTE — Progress Notes (Signed)
PHARMACIST - PHYSICIAN COMMUNICATION ? ?CONCERNING:  Enoxaparin (Lovenox) for DVT Prophylaxis  ? ? ?RECOMMENDATION: ?Patient was prescribed enoxaprin 40mg  q24 hours for VTE prophylaxis.  ? Danley Danker Weights  ? 10/11/21 0014  ?Weight: 112.8 kg (248 lb 10.9 oz)  ? ? ?Body mass index is 35.68 kg/m?. ? ?Estimated Creatinine Clearance: 54.6 mL/min (A) (by C-G formula based on SCr of 1.38 mg/dL (H)). ? ? ?Based on [place] patient is candidate for enoxaparin 0.5mg /kg TBW SQ every 24 hours based on BMI being >30. ? ?DESCRIPTION: ?Pharmacy has adjusted enoxaparin dose per [facility] policy. ? ?Patient is now receiving enoxaparin 0.5 mg/kg every 24 hours  ? ? ?Renda Rolls, PharmD, MBA ?10/11/2021 ?1:31 AM ? ? ?

## 2021-10-12 ENCOUNTER — Inpatient Hospital Stay (HOSPITAL_BASED_OUTPATIENT_CLINIC_OR_DEPARTMENT_OTHER)
Admit: 2021-10-12 | Discharge: 2021-10-12 | Disposition: A | Discharge status: Home / Self Care | Payer: Medicare Other | Attending: Physician Assistant | Admitting: Physician Assistant

## 2021-10-12 ENCOUNTER — Telehealth: Payer: Self-pay | Admitting: *Deleted

## 2021-10-12 ENCOUNTER — Inpatient Hospital Stay (HOSPITAL_BASED_OUTPATIENT_CLINIC_OR_DEPARTMENT_OTHER)
Admit: 2021-10-12 | Discharge: 2021-10-12 | Disposition: A | Discharge status: Home / Self Care | Payer: Medicare Other | Attending: Internal Medicine | Admitting: Internal Medicine

## 2021-10-12 ENCOUNTER — Inpatient Hospital Stay (INDEPENDENT_AMBULATORY_CARE_PROVIDER_SITE_OTHER): Payer: Medicare Other

## 2021-10-12 DIAGNOSIS — I6389 Other cerebral infarction: Secondary | ICD-10-CM | POA: Diagnosis not present

## 2021-10-12 DIAGNOSIS — I10 Essential (primary) hypertension: Secondary | ICD-10-CM | POA: Diagnosis not present

## 2021-10-12 DIAGNOSIS — I639 Cerebral infarction, unspecified: Secondary | ICD-10-CM | POA: Diagnosis not present

## 2021-10-12 DIAGNOSIS — E669 Obesity, unspecified: Secondary | ICD-10-CM | POA: Diagnosis not present

## 2021-10-12 LAB — HEMOGLOBIN A1C
Hgb A1c MFr Bld: 6.4 % — ABNORMAL HIGH (ref 4.8–5.6)
Mean Plasma Glucose: 137 mg/dL

## 2021-10-12 LAB — CBC
HCT: 38.7 % — ABNORMAL LOW (ref 39.0–52.0)
Hemoglobin: 12.4 g/dL — ABNORMAL LOW (ref 13.0–17.0)
MCH: 28 pg (ref 26.0–34.0)
MCHC: 32 g/dL (ref 30.0–36.0)
MCV: 87.4 fL (ref 80.0–100.0)
Platelets: 200 10*3/uL (ref 150–400)
RBC: 4.43 MIL/uL (ref 4.22–5.81)
RDW: 13.4 % (ref 11.5–15.5)
WBC: 7.4 10*3/uL (ref 4.0–10.5)
nRBC: 0 % (ref 0.0–0.2)

## 2021-10-12 LAB — ECHOCARDIOGRAM COMPLETE BUBBLE STUDY
AR max vel: 2.93 cm2
AV Area VTI: 2.91 cm2
AV Area mean vel: 2.79 cm2
AV Mean grad: 7 mmHg
AV Peak grad: 13.5 mmHg
Ao pk vel: 1.84 m/s
Area-P 1/2: 3.36 cm2
MV VTI: 2.48 cm2
S' Lateral: 3.16 cm

## 2021-10-12 LAB — GLUCOSE, CAPILLARY
Glucose-Capillary: 137 mg/dL — ABNORMAL HIGH (ref 70–99)
Glucose-Capillary: 132 mg/dL — ABNORMAL HIGH (ref 70–99)
Glucose-Capillary: 211 mg/dL — ABNORMAL HIGH (ref 70–99)

## 2021-10-12 LAB — BASIC METABOLIC PANEL
Anion gap: 9 (ref 5–15)
BUN: 27 mg/dL — ABNORMAL HIGH (ref 8–23)
CO2: 26 mmol/L (ref 22–32)
Calcium: 9 mg/dL (ref 8.9–10.3)
Chloride: 102 mmol/L (ref 98–111)
Creatinine, Ser: 1.32 mg/dL — ABNORMAL HIGH (ref 0.61–1.24)
GFR, Estimated: 55 mL/min — ABNORMAL LOW (ref >60)
Glucose, Bld: 152 mg/dL — ABNORMAL HIGH (ref 70–99)
Potassium: 3.5 mmol/L (ref 3.5–5.1)
Sodium: 137 mmol/L (ref 135–145)

## 2021-10-12 MED ORDER — EZETIMIBE 10 MG PO TABS
10.0000 mg | ORAL_TABLET | Freq: Every day | ORAL | Status: DC
  Administered 2021-10-12: 10 mg via ORAL
  Filled 2021-10-12: qty 1

## 2021-10-12 MED ORDER — CLOPIDOGREL BISULFATE 75 MG PO TABS
75.0000 mg | ORAL_TABLET | Freq: Every day | ORAL | 0 refills | Status: DC
Start: 2021-10-13 — End: 2021-11-02

## 2021-10-12 MED ORDER — EZETIMIBE 10 MG PO TABS: 10.0000 mg | ORAL_TABLET | Freq: Every day | ORAL | 0 refills | Status: AC

## 2021-10-12 MED ORDER — NEBIVOLOL HCL 5 MG PO TABS: 5.0000 mg | ORAL_TABLET | Freq: Every day | ORAL | Status: DC

## 2021-10-12 NOTE — Progress Notes (Signed)
Discharge instructions reviewed with patient including followup visits and new medications.  Understanding was verbalized and all questions were answered.  IV removed without complication; patient tolerated well.  Patient discharged home via wheelchair in stable condition escorted by volunteer staff.  

## 2021-10-12 NOTE — Care Management CC44 (Signed)
Condition Code 44 Documentation Completed ? ?Patient Details  ?Name: Jeffery Beltran ?MRN: [id number] ?Date of Birth: 1941/06/12 ? ? ?Condition Code 44 given:  Yes ?Patient signature on Condition Code 44 notice:  Yes ?Documentation of 2 MD's agreement:  Yes ?Code 44 added to claim:  Yes ? ? ? ?Chakita Mcgraw E Raynette Arras, LCSW ?10/12/2021, 3:19 PM ? ?

## 2021-10-12 NOTE — Progress Notes (Signed)
Requested to bedside by Neta Ehlers, to perform bubble study with echocardiogram.  Introduced self and discussed bubble study with patient. PIV in place prior to study, right hand.  Flushed PIV with NS with no pain/swelling.  Set up and performed bubble study on Jeffery Beltran's direction during echocardiogram.  Completed bubble study and PIV flushed/locked.  ?

## 2021-10-12 NOTE — Evaluation (Signed)
Physical Therapy Evaluation ?Patient Details ?Name: Jeffery Beltran ?MRN: [id number] ?DOB: 1941-03-27 ?Today's Date: 10/12/2021 ? ?History of Present Illness ? presented to ER secondary to L hand weakness, numbness and discoordination; admitted for TIA/CVA work-up.  Imaging significant for infarct to R frontal cortex  ?Clinical Impression ? Patient resting in bed upon arrival; wife at bedside.  Patient alert and oriented, follows commands and agreeable to mobility efforts with therapist.  Ladon Applebaum "almost complete" resolution of initial symptoms (numbness/weakness L hand).  Denies pain.  Bilat UE/LE strength and ROM grossly symmetrical and WFL; no focal weakness appreciated.  Able to complete bed mobility with indep; sit/stand, basic transfers and gait (300') without assist device, mod indep.  Demonstrates reciprocal stepping with good step height/length, good trunk rotation and arm swing; mild sway at times with dynamic gait components, but no overt buckling or LOB.  Mild higher-level balance deficits noted with tandem stance, SLS; however, patient feels this is baseline, unchanged with this admission.  Good awareness of deficits, good/indep use of compensatory strategies as needed. ?Appears to be at baseline level of functional ability; no acute PT needs identified at this time.  Patient/wife in agreement.   Will complete initial order at this time; please re-consult should needs change. ?   ? ?Recommendations for follow up therapy are one component of a multi-disciplinary discharge planning process, led by the attending physician.  Recommendations may be updated based on patient status, additional functional criteria and insurance authorization. ? ?Follow Up Recommendations No PT follow up ? ?  ?Assistance Recommended at Discharge PRN  ?Patient can return home with the following ?   ? ?  ?Equipment Recommendations    ?Recommendations for Other Services ?    ?  ?Functional Status Assessment Patient has not had a  recent decline in their functional status  ? ?  ?Precautions / Restrictions Precautions ?Precautions: None ?Precaution Comments: FAll risk score 4/10 ?Restrictions ?Weight Bearing Restrictions: No  ? ?  ? ?Mobility ? Bed Mobility ?Overal bed mobility: Independent ?  ?  ?  ?  ?  ?  ?  ?  ? ?Transfers ?Overall transfer level: Modified independent ?Equipment used: None ?  ?  ?  ?  ?  ?  ?  ?  ?  ? ?Ambulation/Gait ?Ambulation/Gait assistance: Modified independent (Device/Increase time) ?Gait Distance (Feet): 300 Feet ?Assistive device: None ?  ?Gait velocity: 10' walk time, 6 second ?  ?  ?General Gait Details: reciprocal stepping with good step height/length, good trunk rotation and arm swing; mild sway at times with dynamic gait components, but no overt buckling or LOB. ? ?Stairs ?  ?  ?  ?  ?  ? ?Wheelchair Mobility ?  ? ?Modified Rankin (Stroke Patients Only) ?  ? ?  ? ?Balance Overall balance assessment: Modified Independent ?  ?  ?  ?  ?  ?  ?  ?  ?  ?  ?  ?  ?  ?  ?  ?Standardized Balance Assessment ?Standardized Balance Assessment : Berg Balance Test ?Merrilee Jansky Balance Test ?Sit to Stand: Able to stand without using hands and stabilize independently ?Standing Unsupported: Able to stand safely 2 minutes ?Sitting with Back Unsupported but Feet Supported on Floor or Stool: Able to sit safely and securely 2 minutes ?Stand to Sit: Sits safely with minimal use of hands ?Transfers: Able to transfer safely, minor use of hands ?Standing Unsupported with Eyes Closed: Able to stand 10 seconds safely ?Standing Ubsupported with Feet Together:  Able to place feet together independently and stand 1 minute safely ?From Standing, Reach Forward with Outstretched Arm: Can reach confidently >25 cm (10") ?From Standing Position, Pick up Object from Floor: Able to pick up shoe safely and easily ?From Standing Position, Turn to Look Behind Over each Shoulder: Looks behind from both sides and weight shifts well ?Turn 360 Degrees: Able to  turn 360 degrees safely in 4 seconds or less ?Standing Unsupported, Alternately Place Feet on Step/Stool: Able to stand independently and complete 8 steps >20 seconds ?Standing Unsupported, One Foot in Front: Able to take small step independently and hold 30 seconds ?Standing on One Leg: Tries to lift leg/unable to hold 3 seconds but remains standing independently ?Total Score: 50 ?  ?   ? ? ? ?Pertinent Vitals/Pain Pain Assessment ?Pain Assessment: No/denies pain  ? ? ?Home Living Family/patient expects to be discharged to:: Private residence ?Living Arrangements: Spouse/significant other ?Available Help at Discharge: Family ?Type of Home: House ?Home Access: Level entry ?  ?  ?  ?Home Layout: One level ?Home Equipment: None ?   ?  ?Prior Function Prior Level of Function : Independent/Modified Independent ?  ?  ?  ?  ?  ?  ?Mobility Comments: Indep with ADLs, household and community mobilization without assist device; denies fall history.  Active, enjoys swimming (approx 1 mile), 3x/week. ?  ?  ? ? ?Hand Dominance  ? Dominant Hand: Right ? ?  ?Extremity/Trunk Assessment  ? Upper Extremity Assessment ?Upper Extremity Assessment: Overall WFL for tasks assessed (grossly 4+ to 5/5 throughout bilat UEs; initial symptoms fully resolved per patient report) ?  ? ?Lower Extremity Assessment ?Lower Extremity Assessment: Overall WFL for tasks assessed (grossly 4+ to 5/5 throughout, bilat knees 4-/5 (chronic, arthritic weakness), unchaged with this admission) ?  ? ?   ?Communication  ? Communication: No difficulties  ?Cognition Arousal/Alertness: Awake/alert ?Behavior During Therapy: [facility] for tasks assessed/performed ?Overall Cognitive Status: Within Functional Limits for tasks assessed ?  ?  ?  ?  ?  ?  ?  ?  ?  ?  ?  ?  ?  ?  ?  ?  ?  ?  ?  ? ?  ?General Comments General comments (skin integrity, edema, etc.): good awareness of high-level balance deficits, using compensatory strategies safely and appropriately ? ?  ?Exercises     ? ?Assessment/Plan  ?  ?PT Assessment Patient does not need any further PT services  ?PT Problem List Decreased mobility;Decreased balance ? ?   ?  ?PT Treatment Interventions Gait training;Functional mobility training;Therapeutic activities;Therapeutic exercise;Balance training;Neuromuscular re-education;Patient/family education   ? ?PT Goals (Current goals can be found in the Care Plan section)  ?Acute Rehab PT Goals ?Patient Stated Goal: to return home ?PT Goal Formulation: All assessment and education complete, DC therapy ?Time For Goal Achievement: 10/12/21 ?Potential to Achieve Goals: Good ? ?  ?Frequency   ?  ? ? ?Co-evaluation   ?  ?  ?  ?  ? ? ?  ?AM-PAC PT "6 Clicks" Mobility  ?Outcome Measure Help needed turning from your back to your side while in a flat bed without using bedrails?: None ?Help needed moving from lying on your back to sitting on the side of a flat bed without using bedrails?: None ?Help needed moving to and from a bed to a chair (including a wheelchair)?: None ?Help needed standing up from a chair using your arms (e.g., wheelchair or bedside chair)?: None ?Help needed  to walk in hospital room?: None ?Help needed climbing 3-5 steps with a railing? : None ?6 Click Score: 24 ? ?  ?End of Session Equipment Utilized During Treatment: Gait belt ?Activity Tolerance: Patient tolerated treatment well ?Patient left: with call bell/phone within reach;in bed;with family/visitor present ?Nurse Communication: Mobility status ?PT Visit Diagnosis: Muscle weakness (generalized) (M62.81);Difficulty in walking, not elsewhere classified (R26.2) ?  ? ?Time: [id number] ?PT Time Calculation (min) (ACUTE ONLY): 16 min ? ? ?Charges:   PT Evaluation ?$PT Eval Moderate Complexity: 1 Mod ?  ?  ?   ? ?Cardelia Sassano H. Owens Shark, PT, DPT, NCS ?10/12/21, 10:33 AM ?[phone] ? ? ?

## 2021-10-12 NOTE — Care Management (Signed)
?  Transition of Care (TOC) Screening Note ? ? ?Patient Details  ?Name: Jeffery Beltran ?Date of Birth: 1940-07-12 ? ? ?Transition of Care (TOC) CM/SW Contact:    ?Caryn Section, RN ?Phone Number: ?10/12/2021, 3:30 PM ? ? ? ?Transition of Care Department [facility]) has reviewed patient and no TOC needs have been identified at this time. We will continue to monitor patient advancement through interdisciplinary progression rounds. If new patient transition needs arise, please place a TOC consult. ?  ?

## 2021-10-12 NOTE — Progress Notes (Signed)
Code stroke activated per Mort Sawyers RN ?

## 2021-10-12 NOTE — Progress Notes (Signed)
Brief Neuro Update: ? ?TTE is pending. Will add Zetia to his Simvastatin given LDL outside goal and trouble tolerating other statins in the past. ? ?He would benefit from outpatient cardiac monitoring for 4 weeks at discharge. ? ?Will recommend dual antiplatelet with Aspirin 81mg  daily along with plavix 75mg  daily x 21 days followed by plavix 75mg  daily alone. ? ?Follow up with Dr. with [facility] Neurology in about 8 weeks. ? ? ?[facility] ?Pager Number Delia Heady ?

## 2021-10-12 NOTE — Evaluation (Signed)
Occupational Therapy Evaluation ?Patient Details ?Name: Jeffery Beltran ?MRN: [id number] ?DOB: 08-Jan-1941 ?Today's Date: 10/12/2021 ? ? ?History of Present Illness presented to ER secondary to L hand weakness, numbness and discoordination; admitted for TIA/CVA work-up.  Imaging significant for infarct to R frontal cortex  ? ?Clinical Impression ?  ?Pt is a 81 y/o male admitted for TIA/CVA.  Pt initially had L hand weakness, numbness, and lack of coordination.  Pt reports all have resolved.  BUE ROM/MMT/coordination all WNL, equal bilaterally.  Pt performing all ADLs at baseline status, being modified indep-indep.  OT reviewed s/s of possible stroke with "FAST" and when to seek medical attention.  Pt acknowledged/verbalized understanding.  No additional skilled OT indicated at this time; no AE needs.  Left pt in room with spouse present.  Pt in agreement with no OT follow up needed d/t baseline status.  ?   ? ?Recommendations for follow up therapy are one component of a multi-disciplinary discharge planning process, led by the attending physician.  Recommendations may be updated based on patient status, additional functional criteria and insurance authorization.  ? ?Follow Up Recommendations ? No OT follow up  ?  ?Assistance Recommended at Discharge None  ?Patient can return home with the following Other (comment) (no assist needed) ? ?  ?Functional Status Assessment ? Patient has not had a recent decline in their functional status  ?Equipment Recommendations ? None recommended by OT  ?  ?Recommendations for Other Services   ? ? ?  ?Precautions / Restrictions Precautions ?Precautions: None ?Precaution Comments: FAll risk score 4/10 ?Restrictions ?Weight Bearing Restrictions: No  ? ?  ? ?Mobility Bed Mobility ?Overal bed mobility: Independent ?  ?  ?  ?  ?  ?  ?  ?Patient Response: Cooperative ? ?Transfers ?Overall transfer level: Modified independent ?Equipment used: None ?  ?  ?  ?  ?  ?  ?  ?  ?  ? ?  ?Balance  Overall balance assessment: Modified Independent ?  ?  ?  ?  ?  ?  ?  ?  ?  ?  ?  ?  ?  ?  ?  ?  ?  ?  ?   ? ?ADL either performed or assessed with clinical judgement  ? ?ADL Overall ADL's : Independent ?  ?  ?  ?  ?  ?  ?  ?  ?  ?  ?  ?  ?  ?  ?  ?  ?  ?  ?  ?   ? ? ? ?Vision Patient Visual Report: No change from baseline ?   ?   ?   ?  ?   ?  ? ?Pertinent Vitals/Pain Pain Assessment ?Pain Assessment: No/denies pain  ? ? ? ?Hand Dominance Right ?  ?Extremity/Trunk Assessment Upper Extremity Assessment ?Upper Extremity Assessment: Overall WFL for tasks assessed (L hand weakness and numbness upon admission which has since resolved) ?  ?Lower Extremity Assessment ?Lower Extremity Assessment: Defer to PT evaluation ?  ?Cervical / Trunk Assessment ?Cervical / Trunk Assessment: Normal ?  ?Communication Communication ?Communication: No difficulties ?  ?Cognition Arousal/Alertness: Awake/alert ?Behavior During Therapy: [facility] for tasks assessed/performed ?Overall Cognitive Status: Within Functional Limits for tasks assessed ?  ?  ?  ?  ?  ?  ?  ?  ?  ?  ?  ?  ?  ?  ?  ?  ?  ?  ?  ?General Comments  good awareness  of high-level balance deficits, using compensatory strategies safely and appropriately ? ?  ?   ?  ?    ? ? ?Home Living Family/patient expects to be discharged to:: Private residence ?Living Arrangements: Spouse/significant other ?Available Help at Discharge: Family ?Type of Home: House ?Home Access: Level entry ?  ?  ?Home Layout: One level ?  ?  ?Bathroom Shower/Tub: Walk-in shower ?  ?Bathroom Toilet: Handicapped height ?  ?  ?Home Equipment: None ?  ?  ?  ? ?  ?Prior Functioning/Environment Prior Level of Function : Independent/Modified Independent ?  ?  ?  ?  ?  ?  ?Mobility Comments: Indep with ADLs, household and community mobilization without assist device; denies fall history.  Active, enjoys swimming (approx 1 mile), 3x/week. ?ADLs Comments: indep ?  ? ?  ?  ?OT Problem List:   ?  ?   ?OT  Treatment/Interventions:    ?  ?OT Goals(Current goals can be found in the care plan section) Acute Rehab OT Goals ?Patient Stated Goal: To go home ?OT Goal Formulation: With patient ?Time For Goal Achievement: 10/12/21 ?Potential to Achieve Goals: Good  ?OT Frequency:   ?  ? ?   ?  ?  ?  ?  ? ?  ?AM-PAC OT "6 Clicks" Daily Activity     ?Outcome Measure Help from another person eating meals?: None ?Help from another person taking care of personal grooming?: None ?Help from another person toileting, which includes using toliet, bedpan, or urinal?: None ?Help from another person bathing (including washing, rinsing, drying)?: None ?Help from another person to put on and taking off regular upper body clothing?: None ?Help from another person to put on and taking off regular lower body clothing?: None ?6 Click Score: 24 ?  ?End of Session Nurse Communication: Mobility status (OT coordinated with PT; PT notified MD of pt at baseline status, no additional therapy needs at d/c) ? ?Activity Tolerance: Patient tolerated treatment well ?Patient left: in bed;with family/visitor present ? ?OT Visit Diagnosis: Muscle weakness (generalized) (M62.81)  ?              ?Time: 1245-8099 ?OT Time Calculation (min): 10 min ?Charges:  OT General Charges ?$OT Visit: 1 Visit ?OT Evaluation ?$OT Eval Low Complexity: 1 Low ? ?Danelle Earthly, MS, OTR/L ? ? ?Jeffery Beltran ?10/12/2021, 10:48 AM ?

## 2021-10-12 NOTE — Progress Notes (Signed)
*  PRELIMINARY RESULTS* ?Echocardiogram ?2D Echocardiogram has been performed. ? ?Jeffery Beltran M Jeffery Beltran ?10/12/2021, 9:42 AM ?

## 2021-10-12 NOTE — Discharge Summary (Addendum)
Physician Discharge Summary  ?Jeffery Beltran [id number] DOB: 1940-11-26 DOA: 10/11/2021 ? ?PCP: Kandyce Rud, MD ? ?Admit date: 10/11/2021 ?Discharge date: 10/12/2021 ? ?Admitted From: home  ?Disposition:  home  ? ?Recommendations for Outpatient Follow-up:  ?Follow up with PCP in 1-2 weeks ?F/u w/ neuro, Dr. Pearlean Brownie, in 8 weeks ?F/u w/ cardio, Dr. Kirke Corin, in 4 weeks  ? ?Home Health: no  ?Equipment/Devices: ? ?Discharge Condition: stable  ?CODE STATUS: full  ?Diet recommendation: Heart Healthy / Carb Modified  ? ?Brief/Interim Summary: ?HPI was taken from Dr. Margo Aye: ?Jeffery Beltran is a 81 y.o. male with medical history significant for essential hypertension, hyperlipidemia, type 2 diabetes, obesity, who presented to [facility] ED from home via EMS due to acute onset of incoordination of his left hand.  He was in his usual state of health prior to this.  At 11:30 PM as he was attempting to use his TV remote control in the bedroom, his left hand felt weak and numb, he could not use it.  EMS was activated.  Patient was brought into the ED where he presented as a code stroke.  Seen by neurology/stroke team.  Had a CT head without contrast which was unremarkable.  Symptoms nearly resolved in the ED.  States his left hand is improved but is not completely back to baseline.  Stroke/neurology team recommended admission for TIA/stroke work-up.  Admitted by hospitalist service, [facility]. ?  ?ED Course: Temperature 98.  BP 160/69, pulse 48, respiratory rate 19, with oxygen saturation 98% on room air.  Lab studies remarkable for serum creatinine 1.38 with GFR 52.  Troponin 28. ? ?As per Dr. Mayford Knife 4/6-10/12/21:  Pt was found to have CVA on admission via MRI. Pt was started on plavix and continued on aspirin, statin. Pt will continue on aspirin, plavix x 21 days and then just aspirin as per neuro. All pt's deficients returned to baseline prior to d/c. Echo showed EF 60-65%, no regional wall motion abnormalities, diastolic function was  indeterminate & no evidence of any interatrial shunt. A zio patch was placed on pt prior to d/c as per neuro recs. Pt will f/u outpatient w/ neuro and cardio. Pt verbalized his understanding. For more information, please see previous progress/consult notes.  ? ?Discharge Diagnoses:  ?Principal Problem: ?  TIA (transient ischemic attack) ?Active Problems: ?  CVA (cerebral vascular accident) (HCC) ? ?CVA: as per MRI. Continue on aspirin, plavix, statin. Continue on aspirin, plavix x 21 days, then continue on plavix only as per neuro. Echo showed EF 60-65%, no interatrial shunt, diastolic function was indeterminate.  ? ?Elevated troponin: minimally elevated, likely secondary to demand ischemia  ?  ?Sinus bradycardia: hold home nebivolol  ?  ?AKI: Cr is trending down from day prior. ?  ?HTN: restart triamterene- HCTZ ?  ?DM2: HbA1c is 6.4, well controlled. Continue on SSI w/ accuchecks ? ?Obesity: BMI 35.6. Complicates overall care & prognosis  ? ?Discharge Instructions ? ?Discharge Instructions   ? ? Ambulatory referral to Neurology   Complete by: As directed ?  ? An appointment is requested in approximately: 8 weeks for follow up for potential cardioembolic stroke.  ? Diet - low sodium heart healthy   Complete by: As directed ?  ? Diet Carb Modified   Complete by: As directed ?  ? Discharge instructions   Complete by: As directed ?  ? F/u w/ PCP in 1-2 weeks. F/u w/ neuro, Dr. Pearlean Brownie, in 8 weeks. F/u w/ cardio, Dr. Kirke Corin, in 4 weeks.  Will recommend dual antiplatelet with aspirin 81mg  daily along with plavix 75mg  daily x 21 days followed by plavix 75mg  daily alone.  ? Increase activity slowly   Complete by: As directed ?  ? ?  ? ?Allergies as of 10/12/2021   ?No Known Allergies ?  ? ?  ?Medication List  ?  ? ?STOP taking these medications   ? ?hydrochlorothiazide 12.5 MG capsule ?Commonly known as: MICROZIDE ?  ?hydrochlorothiazide 12.5 MG tablet ?Commonly known as: HYDRODIURIL ?  ? ?  ? ?TAKE these medications    ? ?aspirin 81 MG chewable tablet ?Chew 81 mg by mouth daily. ?  ?Azopt 1 % ophthalmic suspension ?Generic drug: brinzolamide ?Place 1 drop into both eyes 2 (two) times daily. ?  ?brimonidine 0.1 % Soln ?Commonly known as: ALPHAGAN P ?Place 1 drop into both eyes 2 (two) times daily. ?  ?Alphagan P 0.15 % ophthalmic solution ?Generic drug: brimonidine ?  ?Centrum Silver 50+Men Tabs ?Take 1 tablet by mouth daily. ?  ?clopidogrel 75 MG tablet ?Commonly known as: PLAVIX ?Take 1 tablet (75 mg total) by mouth daily. ?Start taking on: October 13, 2021 ?  ?dexamethasone 0.1 % ophthalmic solution ?Commonly known as: DECADRON ?Place 1 drop into the left eye 4 (four) times daily. ?  ?Dexcom G6 Receiver ?  ?Dexcom G6 Sensor Misc ?SMARTSIG:1 Each Topical Every 10 Days ?  ?Dexcom G6 Transmitter Misc ?  ?ezetimibe 10 MG tablet ?Commonly known as: ZETIA ?Take 1 tablet (10 mg total) by mouth daily. ?Start taking on: October 13, 2021 ?  ?FARXIGA PO ?Take 10 mg/day by mouth daily. ?  ?HumaLOG KwikPen 100 UNIT/ML KwikPen ?Generic drug: insulin lispro ?Inject 30 Units into the skin 3 (three) times daily. ?  ?Insulin Glargine 300 UNIT/ML Sopn ?Inject 16-17 Units into the skin daily after supper. ?  ?latanoprost 0.005 % ophthalmic solution ?Commonly known as: XALATAN ?Place 1 drop into both eyes at bedtime. ?  ?Loratadine 10 MG Caps ?Take by mouth. ?  ?losartan 100 MG tablet ?Commonly known as: COZAAR ?Take by mouth. ?  ?Lotemax SM 0.38 % Gel ?Generic drug: Loteprednol Etabonate ?Place 1 drop into the left eye 4 (four) times daily. ?  ?metFORMIN 500 MG 24 hr tablet ?Commonly known as: GLUCOPHAGE-XR ?1,000 mg daily after supper. ?  ?nebivolol 5 MG tablet ?Commonly known as: BYSTOLIC ?Take 1 tablet (5 mg total) by mouth daily. Hold this medication until you see your PCP and/or cardiologist. HR has been on lower end ?What changed: additional instructions ?  ?NovoFine 32G X 6 MM Misc ?Generic drug: Insulin Pen Needle ?  ?ONE TOUCH ULTRA TEST  test strip ?Generic drug: glucose blood ?  ?Prolensa 0.07 % Soln ?Generic drug: Bromfenac Sodium ?Place 1 drop into the left eye 4 (four) times daily. ?  ?simvastatin 20 MG tablet ?Commonly known as: ZOCOR ?Take 20 mg by mouth at bedtime. ?  ?tamsulosin 0.4 MG Caps capsule ?Commonly known as: FLOMAX ?Take 0.4 mg by mouth daily. ?  ?triamterene-hydrochlorothiazide 37.5-25 MG tablet ?Commonly known as: MAXZIDE-25 ?Take 1 tablet by mouth daily. ?  ? ?  ? ? Follow-up Information   ? ? Hardie Pulley, MD Follow up in 8 week(s).   ?Specialties: Neurology, Radiology ?Contact information: ?[address] ?[address] ?[place] [address] ?[phone] ? ? ?  ?  ? ? [place], MD Follow up in 1 month(s).   ?Specialty: Cardiology ?Contact information: ?[address] ?[address] ?[place]  [phone] ?[phone] ? ? ?  ?  ? ?  ?  ? ?  ? ?No Known Allergies ? ?Consultations: ?Neuro  ? ? ?Procedures/Studies: ?CT ANGIO HEAD NECK W WO CM ? ?Result Date: 10/11/2021 ?CLINICAL DATA:  Stroke workup.  Left arm weakness EXAM: CT ANGIOGRAPHY HEAD AND NECK TECHNIQUE: Multidetector CT imaging of the head and neck was performed using the standard protocol during bolus administration of intravenous contrast. Multiplanar CT image reconstructions and MIPs were obtained to evaluate the vascular anatomy. Carotid stenosis measurements (when applicable) are obtained utilizing NASCET criteria, using the distal internal carotid diameter as the denominator. RADIATION DOSE REDUCTION: This exam was performed according to the departmental dose-optimization program which includes automated exposure control, adjustment of the mA and/or kV according to patient size and/or use of iterative reconstruction technique. CONTRAST:  75mL OMNIPAQUE IOHEXOL 350 MG/ML SOLN COMPARISON:  CT and MRI of the brain from earlier today FINDINGS: CTA NECK FINDINGS Aortic arch: Unremarkable Right carotid system: Minimal atheromatous plaque at the bifurcation. No  stenosis or ulceration. Left carotid system: No detected atheromatous changes. No stenosis or ulceration. Vertebral arteries: No proximal subclavian stenosis. The vertebral arteries are tortuous within the transver

## 2021-10-12 NOTE — Telephone Encounter (Signed)
-----   Message from Sondra Barges, PA-C sent at 10/12/2021 12:56 PM EDT ----- ?Pam, ? ?Can you get this patient set up for a 2-week ZIO AT (to be placed in 2 weeks as he is being discharged with a 2 week ZIO today). Dx: CVA. Follow up with MD only to establish care in 8 weeks. Thanks ? ? ?

## 2021-10-12 NOTE — Telephone Encounter (Signed)
Order entered for live monitor to be shipped out with expected date of 10/26/21.  ?

## 2021-10-26 DIAGNOSIS — I639 Cerebral infarction, unspecified: Secondary | ICD-10-CM

## 2021-11-02 ENCOUNTER — Telehealth: Payer: Self-pay | Admitting: *Deleted

## 2021-11-02 MED ORDER — APIXABAN 5 MG PO TABS: 5.0000 mg | ORAL_TABLET | Freq: Two times a day (BID) | ORAL | 11 refills | Status: DC

## 2021-11-02 NOTE — Telephone Encounter (Signed)
Reviewed results and recommendations with patient. Medication changes reviewed, orders entered, and appointment scheduled. He verbalized understanding of our conversation with no further questions at this time.  ?

## 2021-11-02 NOTE — Telephone Encounter (Signed)
-----   Message from Rise Mu, PA-C sent at 11/02/2021  3:17 PM EDT ----- ?Heart monitor showed sinus rhythm with an average rate of 58 bpm (range 36-142 bpm), 73 episodes of SVT with the fastest and longest interval lasting 10.2 seconds, Afib/flutter was noted with a 7% burden with rates ranging from 44-142 bpm with the longest episode lasting 15 hours and 4 minutes. 1 pause was noted lasting 3 seconds.  ? ?Recommendations: ?-Heart rate precludes escalation of rate controlling medication ?-With Afib noted, patient should be anticoagulated if there is not a contraindication  ?-Stop aspirin and Plavix ?-Start Eliquis 5 mg bid (does not meet reduced dosing criteria) ?-CHADS2VASc at least 7 (HTN, age x 2, DM, CVA x 2, vascular disease) ?-Move up his appointment to be seen next week, if possible, will need follow up labs at that time as well ?-Discussed with Dr. Fletcher Anon ?

## 2021-11-05 ENCOUNTER — Telehealth: Payer: Self-pay | Admitting: Cardiovascular Disease

## 2021-11-05 NOTE — Telephone Encounter (Signed)
CRITICAL VALUE STICKER ? ?CRITICAL VALUE: Atrial flutter 74-95 bpm for 90 seconds at 2:42 pm 11/05/21 they will upload documentation in [employer] here shortly.  ? ?RECEIVER (on-site recipient of call): Rinaldo Cloud A. ? ?DATE & TIME NOTIFIED: 11/05/21 at 04:36 pm ? ?MESSENGER (representative from company): Sam ? ?Patient has appointment tomorrow here in our office to see Dr. Kirke Corin. He is on apixaban. Will route to provider for review given he is coming in tomorrow.  ?

## 2021-11-05 NOTE — Telephone Encounter (Signed)
Irhythm with critical results ?

## 2021-11-06 ENCOUNTER — Observation Stay
Admission: EM | Admit: 2021-11-06 | Discharge: 2021-11-07 | Disposition: A | Discharge status: Home / Self Care | Payer: Medicare Other | Attending: Student in an Organized Health Care Education/Training Program | Admitting: Student in an Organized Health Care Education/Training Program

## 2021-11-06 ENCOUNTER — Encounter: Payer: Self-pay | Admitting: Cardiovascular Disease

## 2021-11-06 ENCOUNTER — Emergency Department: Payer: Medicare Other

## 2021-11-06 ENCOUNTER — Ambulatory Visit (INDEPENDENT_AMBULATORY_CARE_PROVIDER_SITE_OTHER): Payer: Medicare Other | Admitting: Cardiovascular Disease

## 2021-11-06 VITALS — BP 128/74 | HR 55 | Ht 70.0 in | Wt 237.5 lb

## 2021-11-06 DIAGNOSIS — I639 Cerebral infarction, unspecified: Secondary | ICD-10-CM | POA: Diagnosis not present

## 2021-11-06 DIAGNOSIS — E119 Type 2 diabetes mellitus without complications: Secondary | ICD-10-CM

## 2021-11-06 DIAGNOSIS — E785 Hyperlipidemia, unspecified: Secondary | ICD-10-CM | POA: Diagnosis not present

## 2021-11-06 DIAGNOSIS — R200 Anesthesia of skin: Secondary | ICD-10-CM | POA: Diagnosis present

## 2021-11-06 DIAGNOSIS — Z8673 Personal history of transient ischemic attack (TIA), and cerebral infarction without residual deficits: Secondary | ICD-10-CM | POA: Insufficient documentation

## 2021-11-06 DIAGNOSIS — I480 Paroxysmal atrial fibrillation: Secondary | ICD-10-CM | POA: Diagnosis not present

## 2021-11-06 DIAGNOSIS — I129 Hypertensive chronic kidney disease with stage 1 through stage 4 chronic kidney disease, or unspecified chronic kidney disease: Secondary | ICD-10-CM | POA: Diagnosis not present

## 2021-11-06 DIAGNOSIS — N1831 Chronic kidney disease, stage 3a: Secondary | ICD-10-CM | POA: Insufficient documentation

## 2021-11-06 DIAGNOSIS — Z7901 Long term (current) use of anticoagulants: Secondary | ICD-10-CM | POA: Diagnosis not present

## 2021-11-06 DIAGNOSIS — Z79899 Other long term (current) drug therapy: Secondary | ICD-10-CM | POA: Insufficient documentation

## 2021-11-06 DIAGNOSIS — I10 Essential (primary) hypertension: Secondary | ICD-10-CM | POA: Diagnosis not present

## 2021-11-06 DIAGNOSIS — E1122 Type 2 diabetes mellitus with diabetic chronic kidney disease: Secondary | ICD-10-CM | POA: Insufficient documentation

## 2021-11-06 DIAGNOSIS — Z7984 Long term (current) use of oral hypoglycemic drugs: Secondary | ICD-10-CM | POA: Diagnosis not present

## 2021-11-06 DIAGNOSIS — Z794 Long term (current) use of insulin: Secondary | ICD-10-CM | POA: Diagnosis not present

## 2021-11-06 DIAGNOSIS — I63411 Cerebral infarction due to embolism of right middle cerebral artery: Secondary | ICD-10-CM

## 2021-11-06 LAB — CBC
HCT: 42.3 % (ref 39.0–52.0)
Hemoglobin: 13.4 g/dL (ref 13.0–17.0)
MCH: 27.8 pg (ref 26.0–34.0)
MCHC: 31.7 g/dL (ref 30.0–36.0)
MCV: 87.8 fL (ref 80.0–100.0)
Platelets: 223 10*3/uL (ref 150–400)
RBC: 4.82 MIL/uL (ref 4.22–5.81)
RDW: 13.3 % (ref 11.5–15.5)
WBC: 8.5 10*3/uL (ref 4.0–10.5)
nRBC: 0 % (ref 0.0–0.2)

## 2021-11-06 LAB — DIFFERENTIAL
Abs Immature Granulocytes: 0.05 10*3/uL (ref 0.00–0.07)
Basophils Absolute: 0.1 10*3/uL (ref 0.0–0.1)
Basophils Relative: 1 %
Eosinophils Absolute: 0.2 10*3/uL (ref 0.0–0.5)
Eosinophils Relative: 3 %
Immature Granulocytes: 1 %
Lymphocytes Relative: 25 %
Lymphs Abs: 2.1 10*3/uL (ref 0.7–4.0)
Monocytes Absolute: 0.8 10*3/uL (ref 0.1–1.0)
Monocytes Relative: 10 %
Neutro Abs: 5.2 10*3/uL (ref 1.7–7.7)
Neutrophils Relative %: 60 %

## 2021-11-06 LAB — COMPREHENSIVE METABOLIC PANEL
ALT: 18 U/L (ref 0–44)
AST: 20 U/L (ref 15–41)
Albumin: 4 g/dL (ref 3.5–5.0)
Alkaline Phosphatase: 79 U/L (ref 38–126)
Anion gap: 10 (ref 5–15)
BUN: 37 mg/dL — ABNORMAL HIGH (ref 8–23)
CO2: 28 mmol/L (ref 22–32)
Calcium: 9.7 mg/dL (ref 8.9–10.3)
Chloride: 99 mmol/L (ref 98–111)
Creatinine, Ser: 1.67 mg/dL — ABNORMAL HIGH (ref 0.61–1.24)
GFR, Estimated: 41 mL/min — ABNORMAL LOW (ref >60)
Glucose, Bld: 159 mg/dL — ABNORMAL HIGH (ref 70–99)
Potassium: 3.6 mmol/L (ref 3.5–5.1)
Sodium: 137 mmol/L (ref 135–145)
Total Bilirubin: 0.6 mg/dL (ref 0.3–1.2)
Total Protein: 7.5 g/dL (ref 6.5–8.1)

## 2021-11-06 LAB — PROTIME-INR
INR: 1.2 (ref 0.8–1.2)
Prothrombin Time: 15 seconds (ref 11.4–15.2)

## 2021-11-06 LAB — CBG MONITORING, ED: Glucose-Capillary: 151 mg/dL — ABNORMAL HIGH (ref 70–99)

## 2021-11-06 LAB — APTT: aPTT: 33 seconds (ref 24–36)

## 2021-11-06 IMAGING — CT CT HEAD CODE STROKE
4 series · 16 of 47 positions shown, 18 images · non-contrast
Comparison: None Available.

CLINICAL DATA: Code stroke.  Left arm and leg weakness



[Series 3: head bone · axial · 0.44mm/px · z∈[+1348,+1380]mm · 3 of 80 slices shown]
[im 8/80  bone]
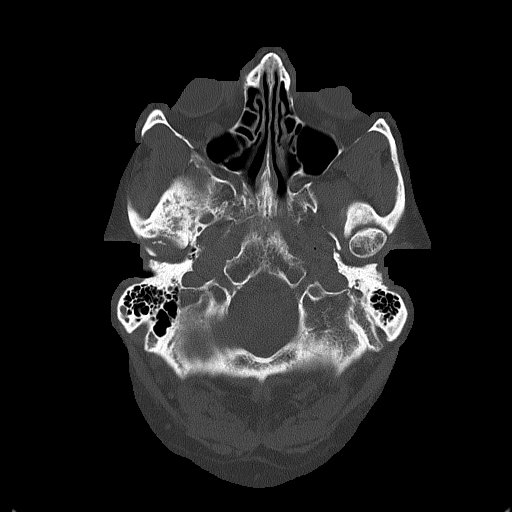
[im 16/80  bone]
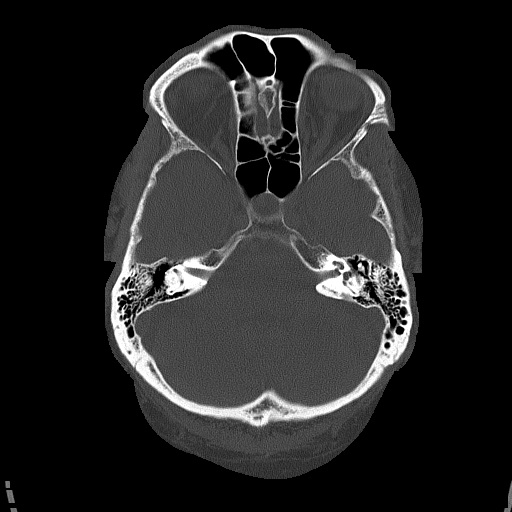
[im 24/80  bone]
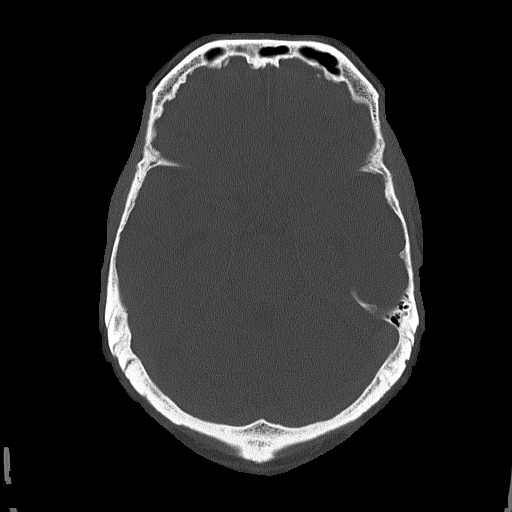

[Series 4: head wo · axial · 0.44mm/px · z∈[+1349,+1469]mm · 7 of 32 slices shown, 9 images]
[im 4/32  brain]
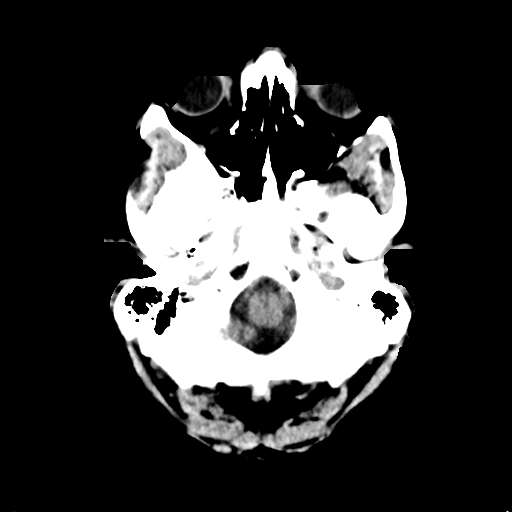
[im 4/32  bone]
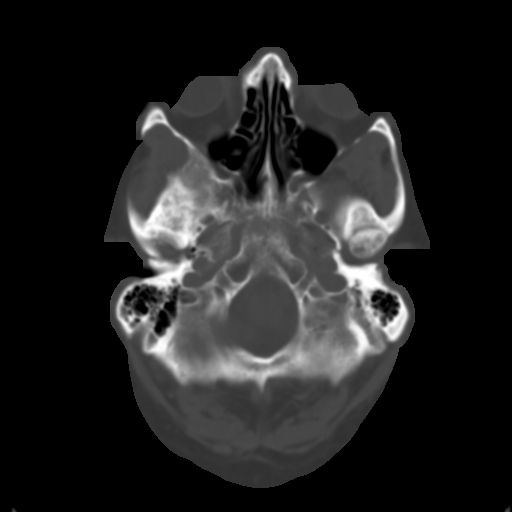
[im 8/32  brain]
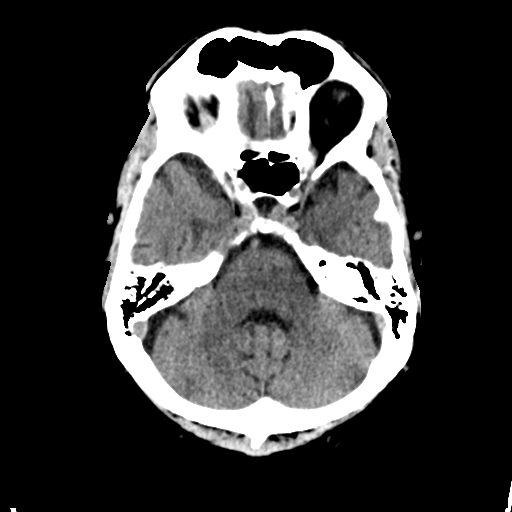
[im 12/32  brain]
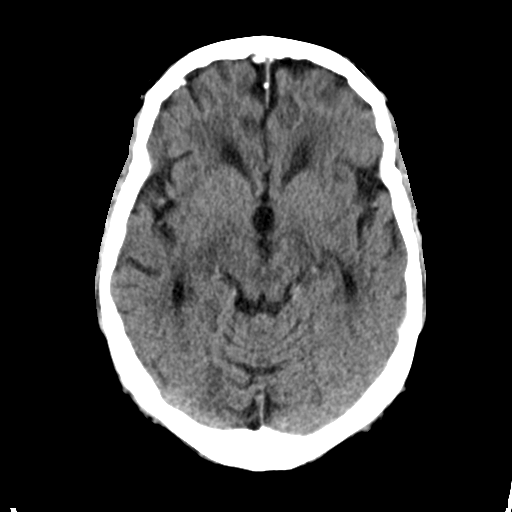
[im 16/32  brain]
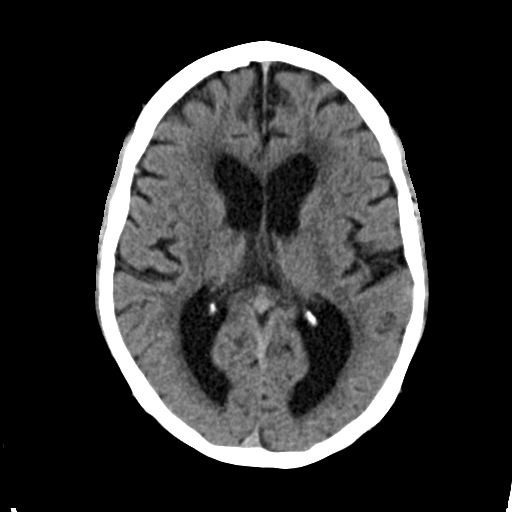
[im 20/32  brain]
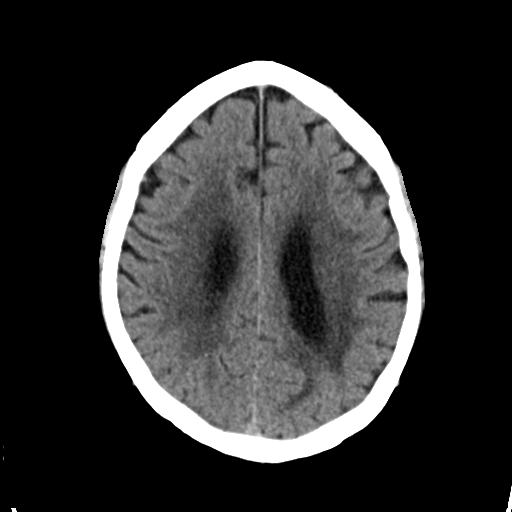
[im 20/32  bone]
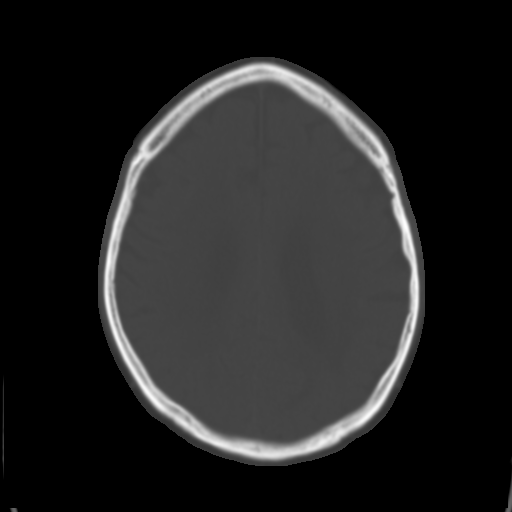
[im 24/32  brain]
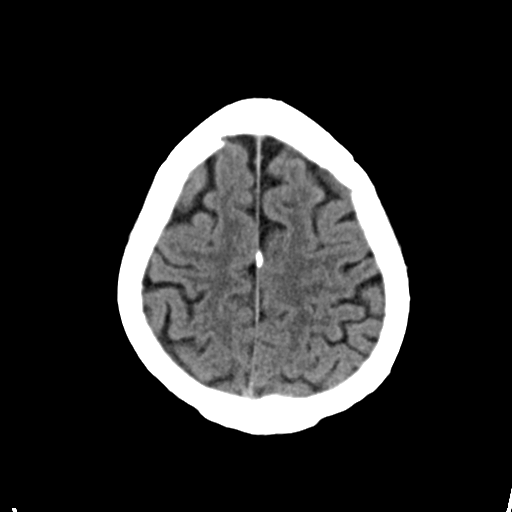
[im 28/32  brain]
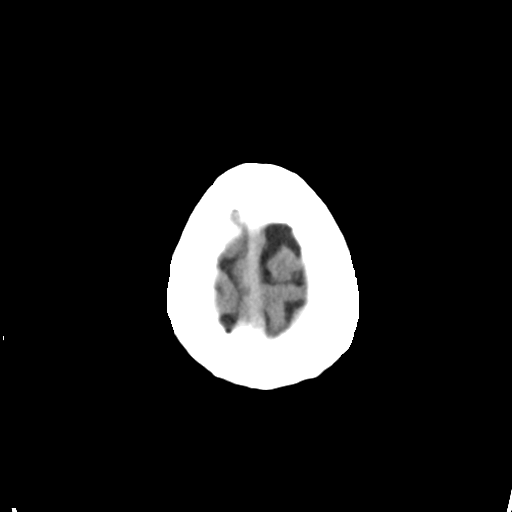

[Series 5: coronal soft tissue · coronal · 0.32mm/px · 3 of 67 slices shown]
[im 23/67  brain]
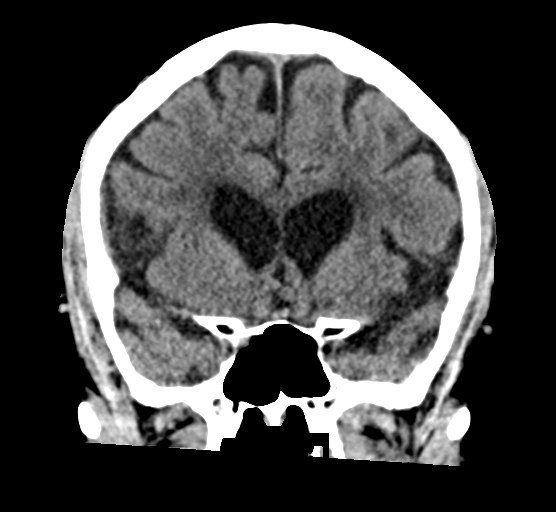
[im 30/67  brain]
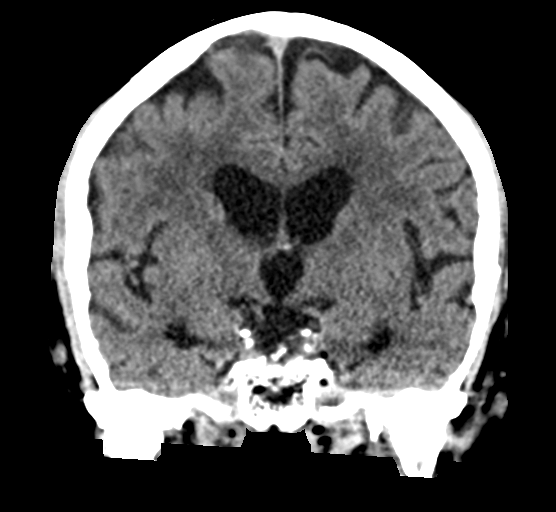
[im 37/67  brain]
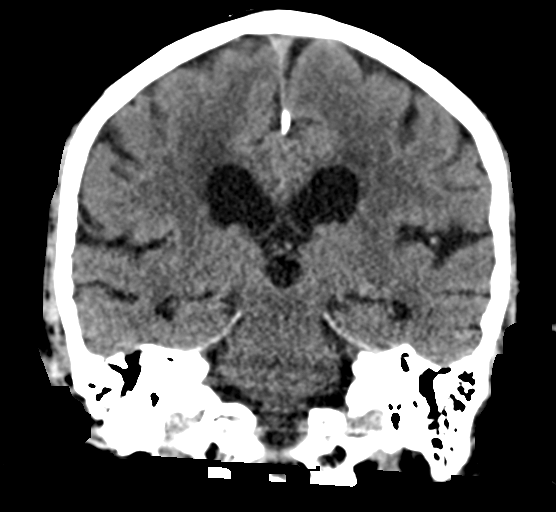

[Series 6: sagittal soft tissue · sagittal · 0.32mm/px · 3 of 54 slices shown]
[im 18/54  brain]
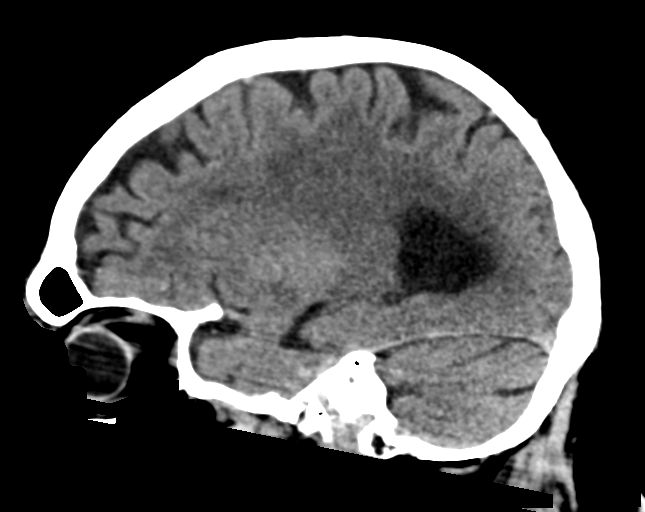
[im 27/54  brain]
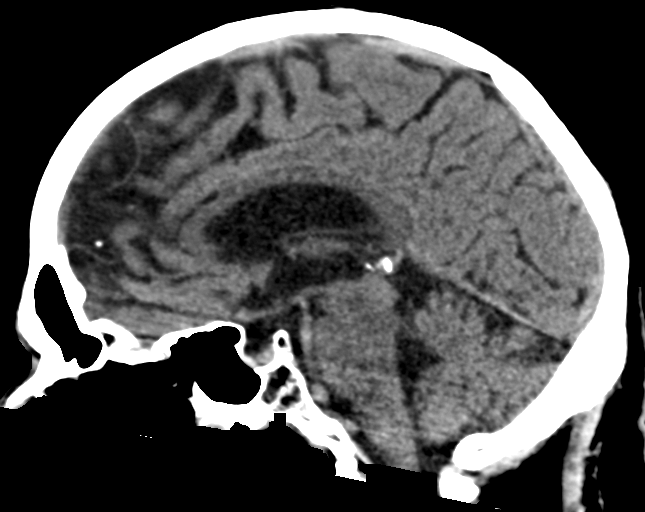
[im 36/54  brain]
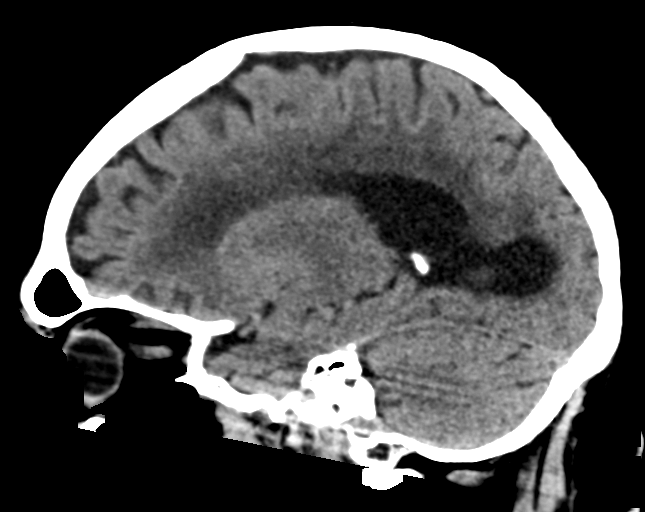

[16 of 47 positions shown; findings below may reference images not displayed]

FINDINGS: Brain: There is no mass, hemorrhage or extra-axial collection. There
is generalized atrophy without lobar predilection. There is
hypoattenuation of the periventricular white matter, most commonly
indicating chronic ischemic microangiopathy.

Vascular: No abnormal hyperdensity of the major intracranial
arteries or dural venous sinuses. No intracranial atherosclerosis.

Skull: The visualized skull base, calvarium and extracranial soft
tissues are normal.

Sinuses/Orbits: No fluid levels or advanced mucosal thickening of
the visualized paranasal sinuses. No mastoid or middle ear effusion.
The orbits are normal.

ASPECTS (Alberta Stroke Program Early CT Score)

- Ganglionic level infarction (caudate, lentiform nuclei, internal
capsule, insula, M1-M3 cortex): 7

- Supraganglionic infarction (M4-M6 cortex): 3

Total score (0-10 with 10 being normal): 10
IMPRESSION: 1. No acute intracranial abnormality.
2. ASPECTS is 10.

These results were called by telephone at the time of interpretation
on [DATE] at [DATE] to provider NILS TERJE , who verbally
acknowledged these results.

## 2021-11-06 IMAGING — MR MR HEAD W/O CM
11 series · 46 of 48 positions shown · IV contrast (gadavist)
Comparison: Brain MRI [DATE]

CLINICAL DATA: Left-sided weakness



[Series 5: ax dwi_tracew · axial · 3.0mm · 0.65mm/px · z∈[-5,+150]mm · 3 of 48 slices shown]
[im 1/48]
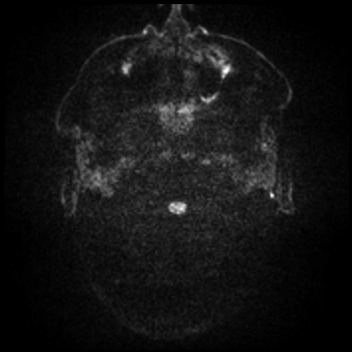
[im 24/48]
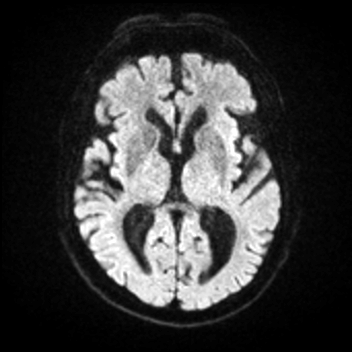
[im 48/48]
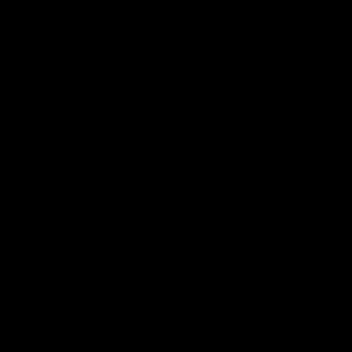

[Series 6: ax dwi_adc · axial · 3.0mm · 0.65mm/px · z∈[-5,+137]mm · 4 of 44 slices shown]
[im 1/44]
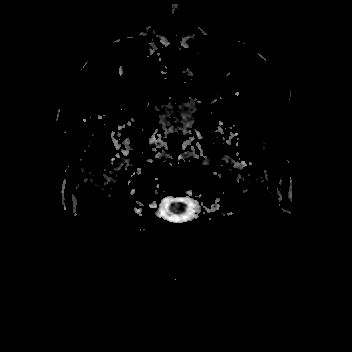
[im 15/44]
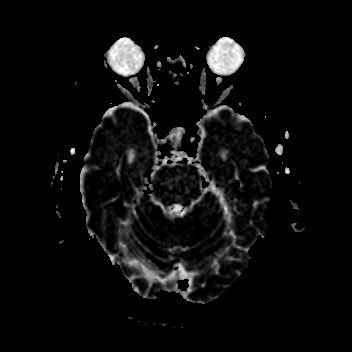
[im 29/44]
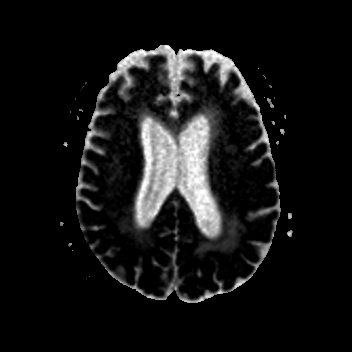
[im 44/44]
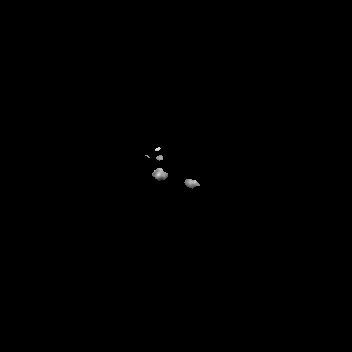

[Series 7: cor dwi_tracew · coronal · 5.0mm · 0.65mm/px · 3 of 40 slices shown]
[im 1/40]
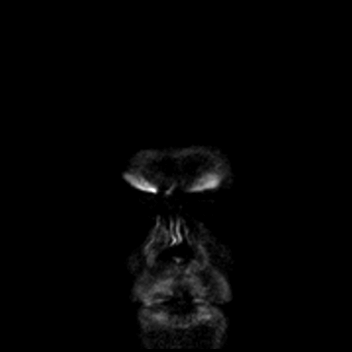
[im 20/40]
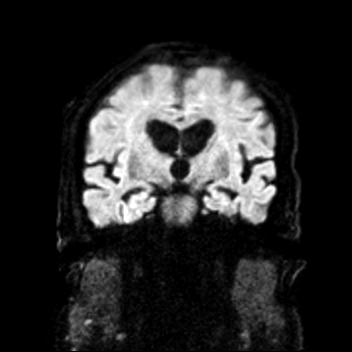
[im 40/40]
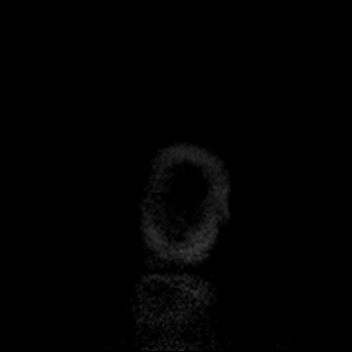

[Series 8: cor dwi_adc · coronal · 5.0mm · 0.65mm/px · 3 of 40 slices shown]
[im 1/40]
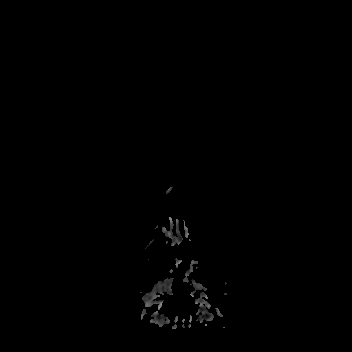
[im 20/40]
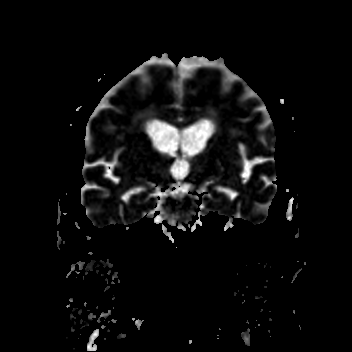
[im 40/40]
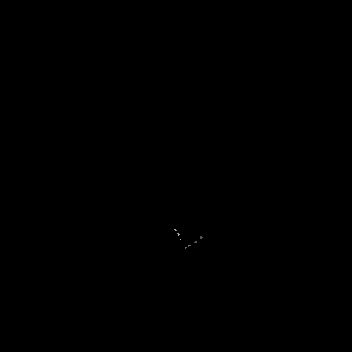

[Series 9: T1 · sagittal · 5.0mm · 0.94mm/px · 2 of 21 slices shown (1 of 2)]
[im 1/21]
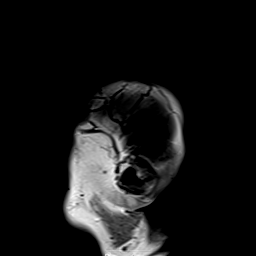
[im 21/21]
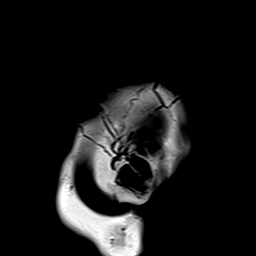

[Series 10: T2 · axial · 5.0mm · 0.53mm/px · z∈[+0,+144]mm · 2 of 25 slices shown (1 of 2)]
[im 1/25]
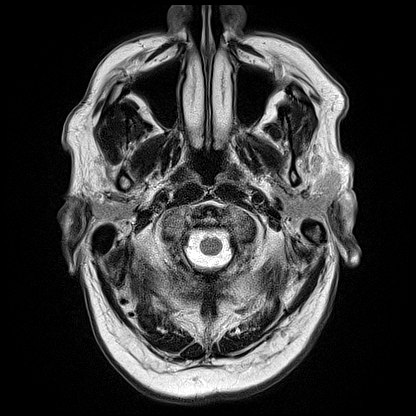
[im 25/25]
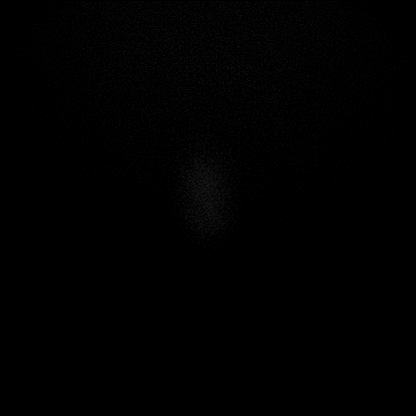

[Series 12: pha_images · axial · 3.0mm · 0.90mm/px · z∈[-16,+149]mm · 5 of 55 slices shown]
[im 1/55]
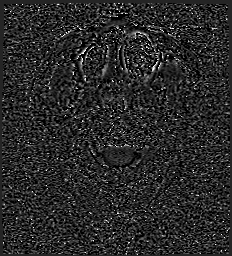
[im 14/55]
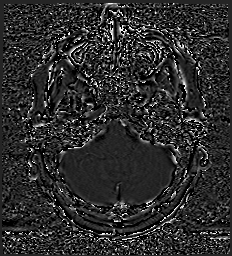
[im 28/55]
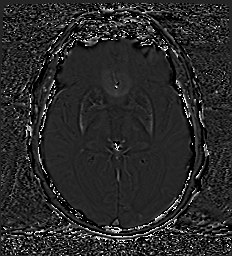
[im 41/55]
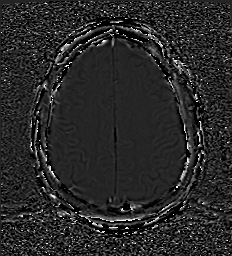
[im 55/55]
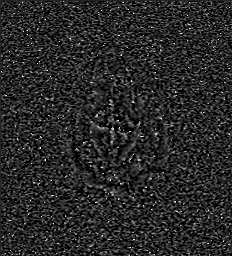

[Series 13: swi_images · axial · 3.0mm · 0.90mm/px · z∈[-16,+161]mm · 5 of 60 slices shown]
[im 1/60]
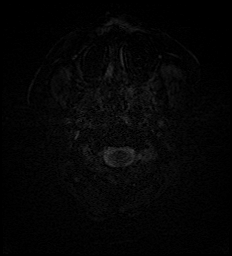
[im 15/60]
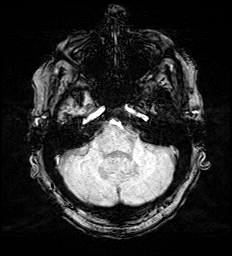
[im 30/60]
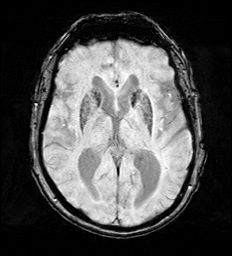
[im 45/60]
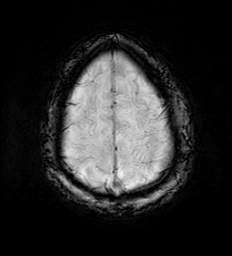
[im 60/60]
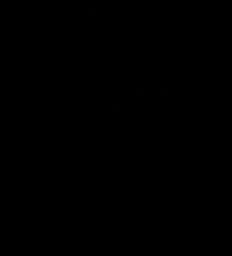

[Series 15: FLAIR · axial · 3.0mm · 0.53mm/px · z∈[-9,+153]mm · 5 of 55 slices shown]
[im 1/55]
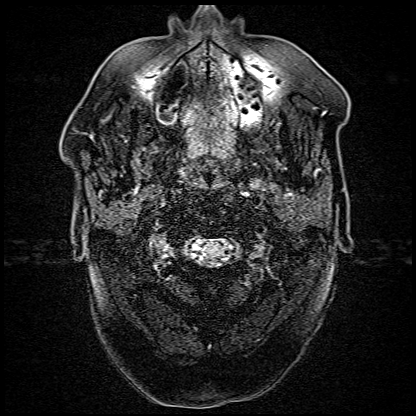
[im 14/55]
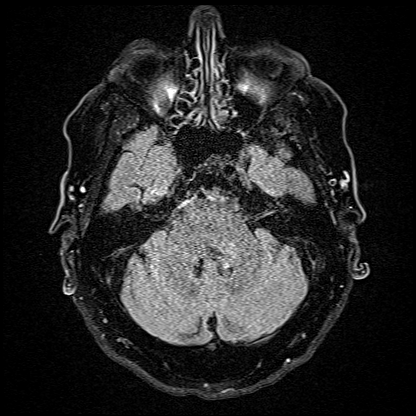
[im 28/55]
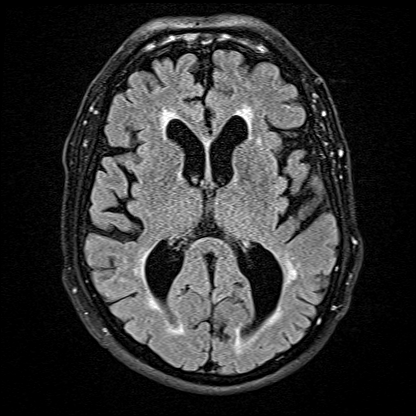
[im 41/55]
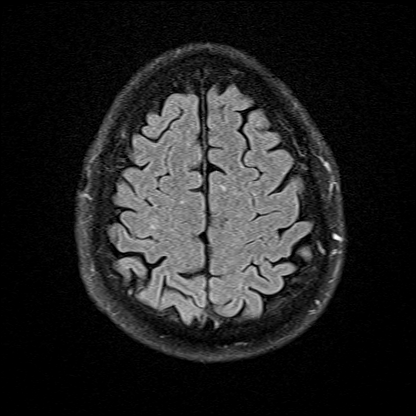
[im 55/55]
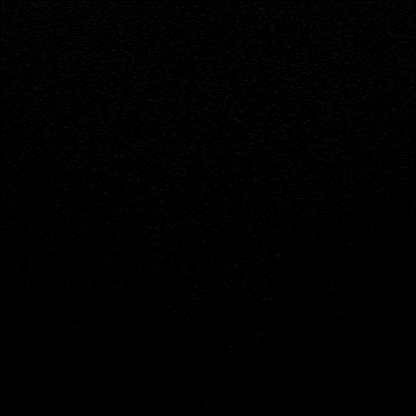

[Series 16: T1 · axial · 1.0mm · 0.98mm/px · z∈[-16,+159]mm · 12 of 168 slices shown (2 of 2)]
[im 1/168]
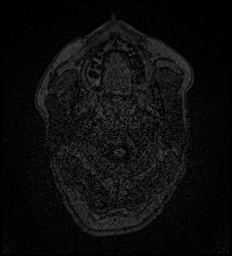
[im 13/168]
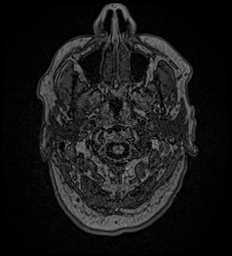
[im 26/168]
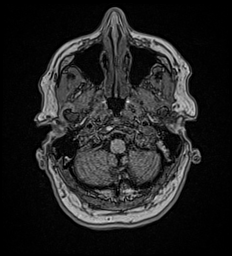
[im 39/168]
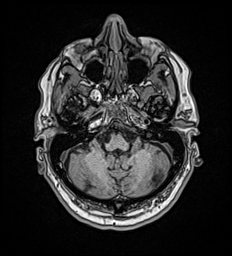
[im 52/168]
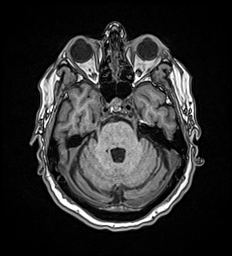
[im 65/168]
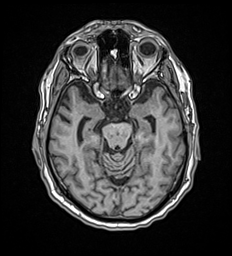
[im 78/168]
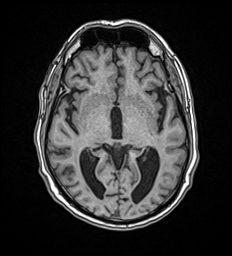
[im 90/168]
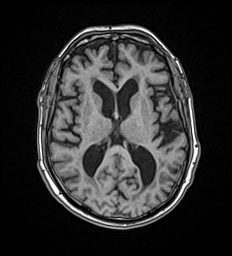
[im 103/168]
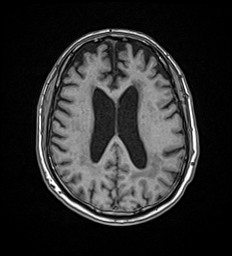
[im 116/168]
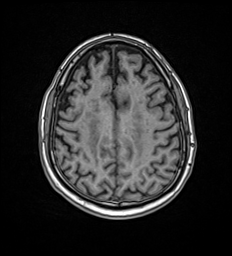
[im 142/168]
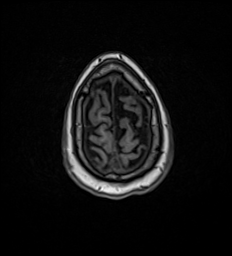
[im 168/168]
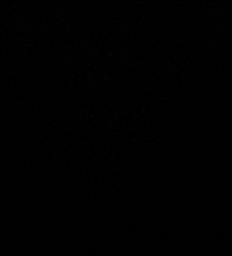

[Series 17: T2 · coronal · 5.0mm · 0.57mm/px · 2 of 29 slices shown (2 of 2)]
[im 1/29]
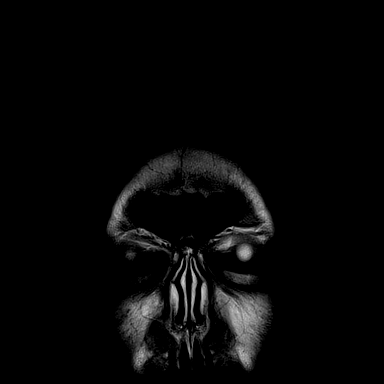
[im 29/29]
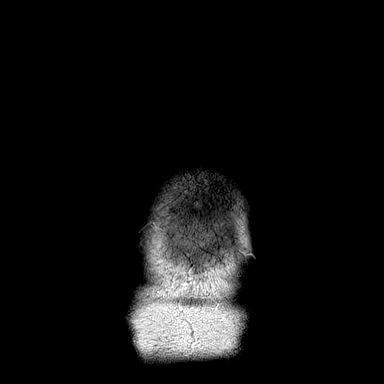

[46 of 48 positions shown; findings below may reference images not displayed]

FINDINGS: MRI HEAD FINDINGS

Brain: Area of diffusion abnormality the right precentral sulcus,
likely subacute ischemia. No acute or chronic hemorrhage. There is
multifocal hyperintense T2-weighted signal within the white matter.
Generalized cerebral volume loss. The midline structures are normal.

Vascular: Major flow voids are preserved.

Skull and upper cervical spine: Normal calvarium and skull base.
Visualized upper cervical spine and soft tissues are normal.

Sinuses/Orbits:No paranasal sinus fluid levels or advanced mucosal
thickening. No mastoid or middle ear effusion. Normal orbits.

MRA HEAD FINDINGS

POSTERIOR CIRCULATION:

--Vertebral arteries: Normal

--Inferior cerebellar arteries: Normal.

--Basilar artery: Normal.

--Superior cerebellar arteries: Normal.

--Posterior cerebral arteries: Mild narrowing of the proximal left
P2 segment.

ANTERIOR CIRCULATION:

--Intracranial internal carotid arteries: Normal.

--Anterior cerebral arteries (ACA): Normal.

--Middle cerebral arteries (MCA): Normal.

MRA NECK FINDINGS

Aortic arch: Normal branching pattern

Right carotid system: Normal.  No stenosis.

Left carotid system: Normal.  No stenosis.

Vertebral arteries: Left dominant.  Normal.

Other: None
IMPRESSION: 1. Small focus of subacute ischemia within the right precentral
sulcus. No hemorrhage or mass effect.
2. Normal intracranial MRA.
3. Normal MRA of the neck.

## 2021-11-06 IMAGING — MR MR MRA HEAD W/O CM
1 series · 26 of 48 positions shown · IV contrast (gadavist)
Comparison: Brain MRI [DATE]

CLINICAL DATA: Left-sided weakness



[Series 7: TOF · axial · 0.5mm · 0.48mm/px · z∈[-10,+85]mm · 26 of 217 slices shown]
[im 1/217]
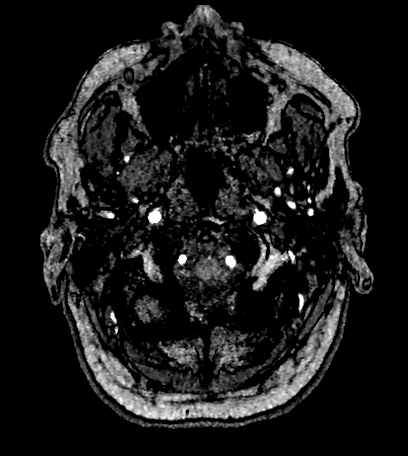
[im 5/217]
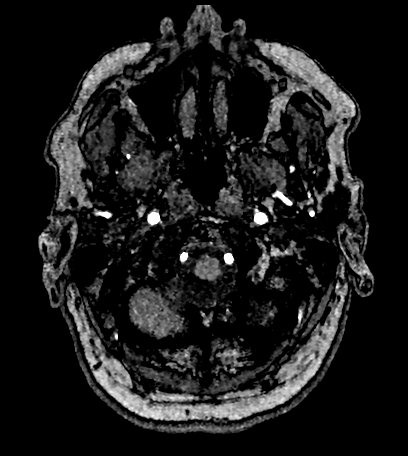
[im 10/217]
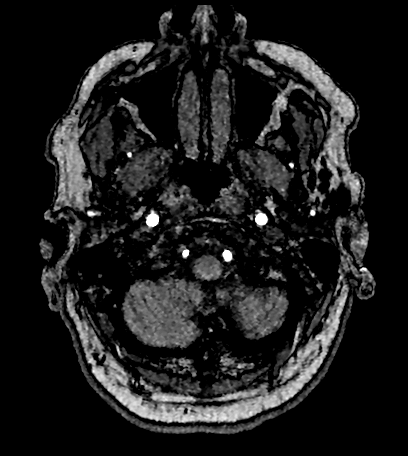
[im 14/217]
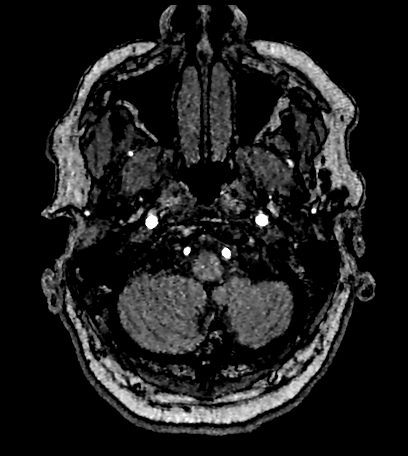
[im 19/217]
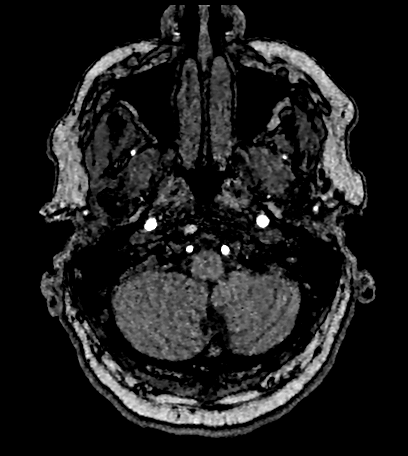
[im 23/217]
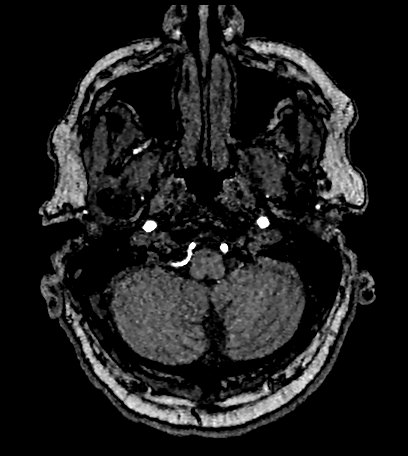
[im 28/217]
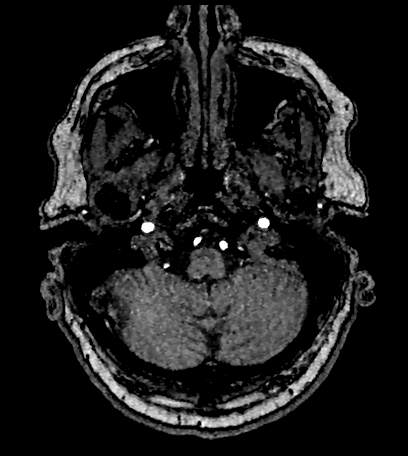
[im 33/217]
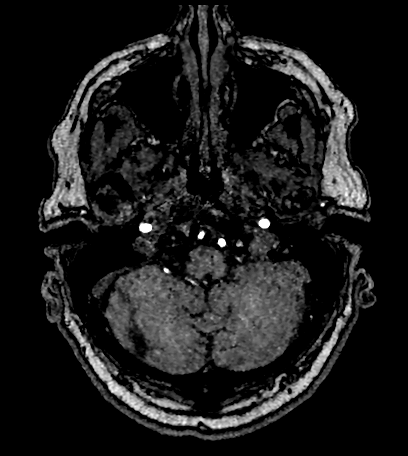
[im 37/217]
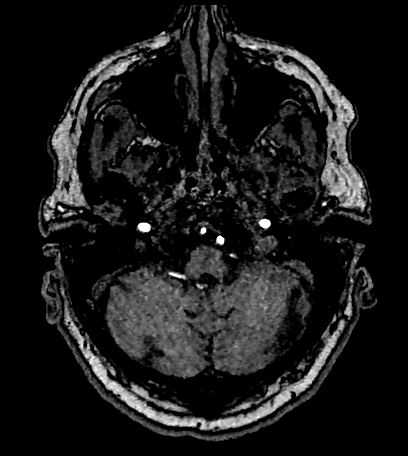
[im 42/217]
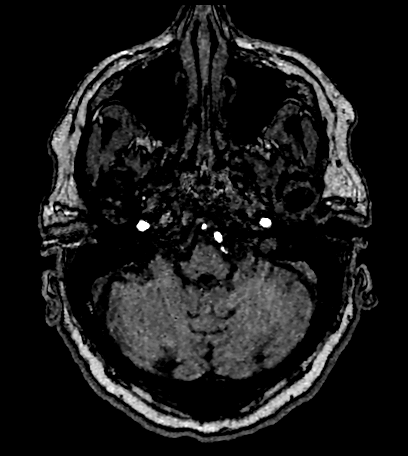
[im 46/217]
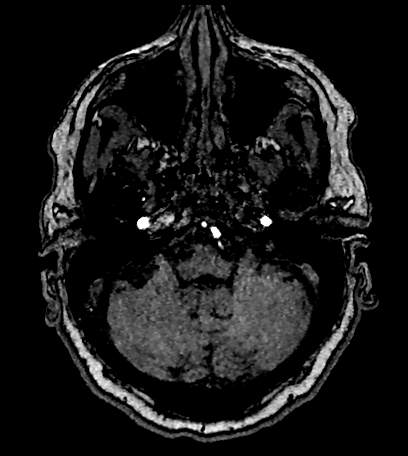
[im 51/217]
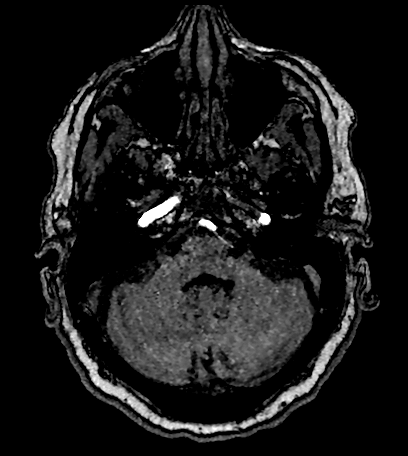
[im 56/217]
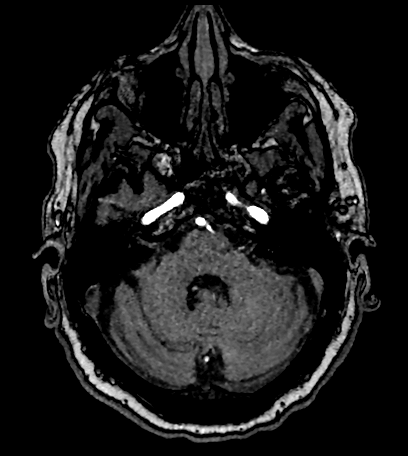
[im 60/217]
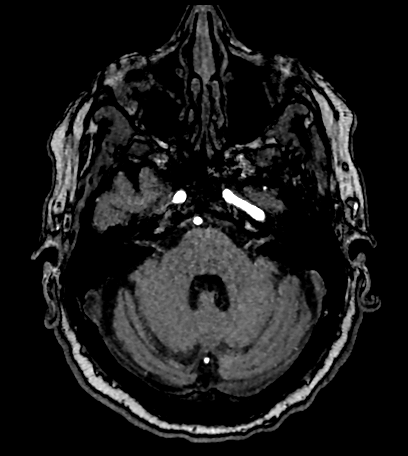
[im 65/217]
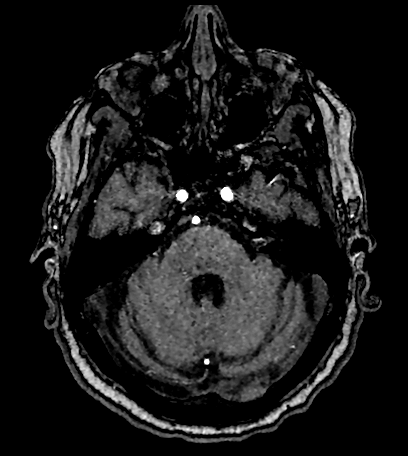
[im 69/217]
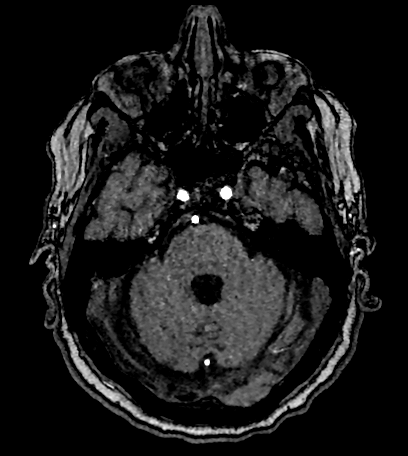
[im 74/217]
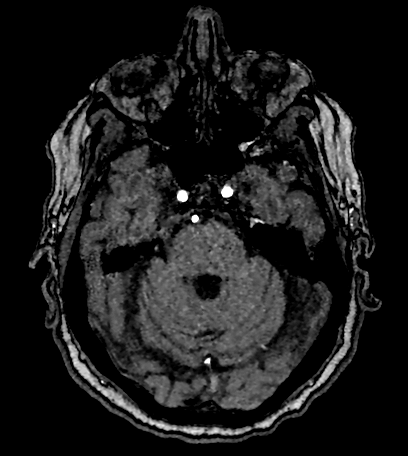
[im 79/217]
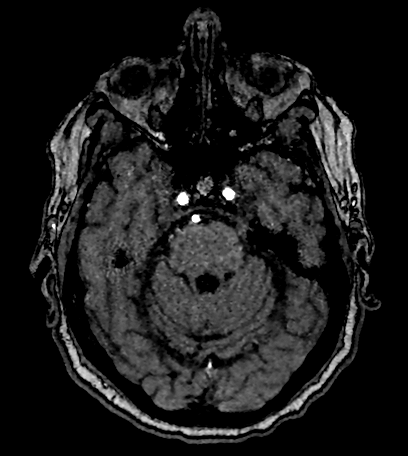
[im 83/217]
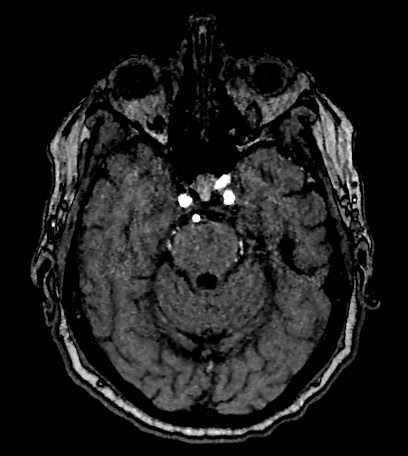
[im 97/217]
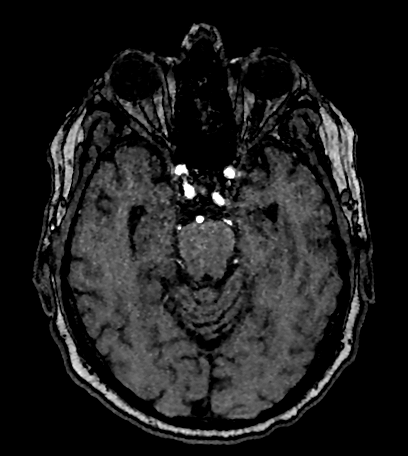
[im 111/217]
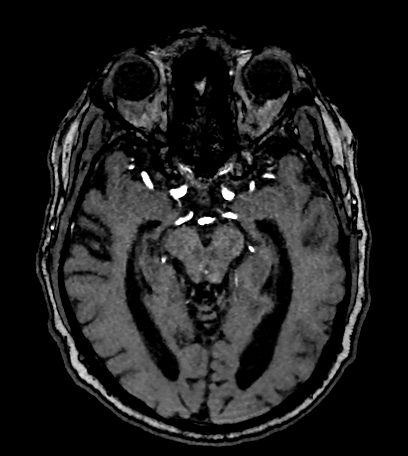
[im 125/217]
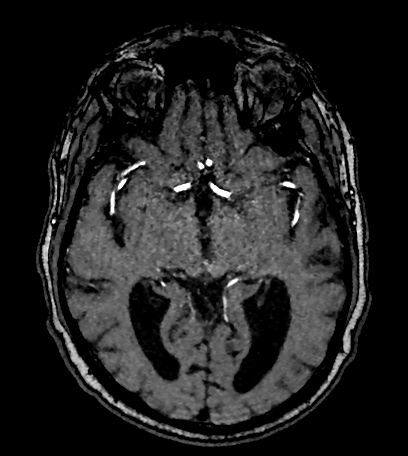
[im 152/217]
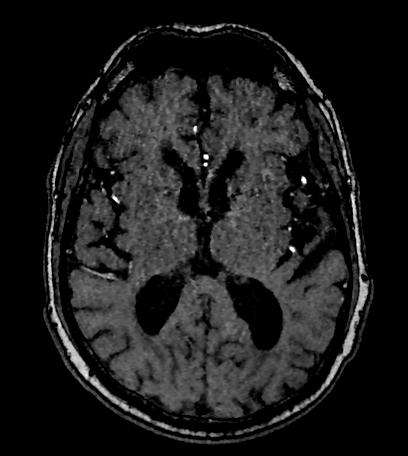
[im 180/217]
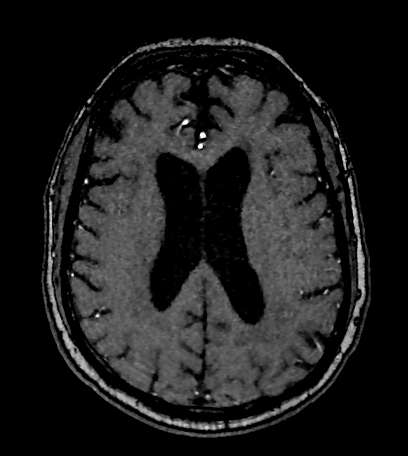
[im 184/217]
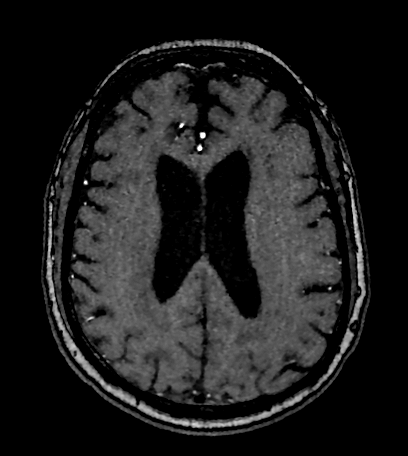
[im 207/217]
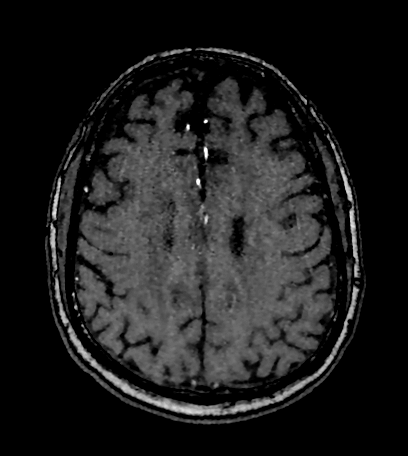

[26 of 48 positions shown; findings below may reference images not displayed]

FINDINGS: MRI HEAD FINDINGS

Brain: Area of diffusion abnormality the right precentral sulcus,
likely subacute ischemia. No acute or chronic hemorrhage. There is
multifocal hyperintense T2-weighted signal within the white matter.
Generalized cerebral volume loss. The midline structures are normal.

Vascular: Major flow voids are preserved.

Skull and upper cervical spine: Normal calvarium and skull base.
Visualized upper cervical spine and soft tissues are normal.

Sinuses/Orbits:No paranasal sinus fluid levels or advanced mucosal
thickening. No mastoid or middle ear effusion. Normal orbits.

MRA HEAD FINDINGS

POSTERIOR CIRCULATION:

--Vertebral arteries: Normal

--Inferior cerebellar arteries: Normal.

--Basilar artery: Normal.

--Superior cerebellar arteries: Normal.

--Posterior cerebral arteries: Mild narrowing of the proximal left
P2 segment.

ANTERIOR CIRCULATION:

--Intracranial internal carotid arteries: Normal.

--Anterior cerebral arteries (ACA): Normal.

--Middle cerebral arteries (MCA): Normal.

MRA NECK FINDINGS

Aortic arch: Normal branching pattern

Right carotid system: Normal.  No stenosis.

Left carotid system: Normal.  No stenosis.

Vertebral arteries: Left dominant.  Normal.

Other: None
IMPRESSION: 1. Small focus of subacute ischemia within the right precentral
sulcus. No hemorrhage or mass effect.
2. Normal intracranial MRA.
3. Normal MRA of the neck.

## 2021-11-06 IMAGING — MR MR MRA NECK WO/W CM
1 of 2 series · 20 of 48 positions shown · IV contrast (10ml Gadavist)
Comparison: Brain MRI [DATE]

CLINICAL DATA: Left-sided weakness



[Series 19: angio_fl3d_cor_post_ttc=2.0s_moco-adv_sub · coronal · 0.9mm · 0.85mm/px · 20 of 92 slices shown]
[im 1/92]
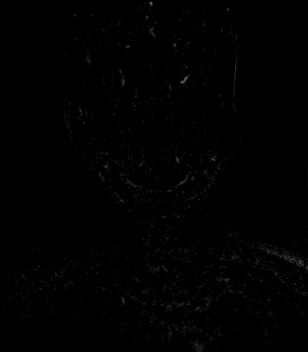
[im 5/92]
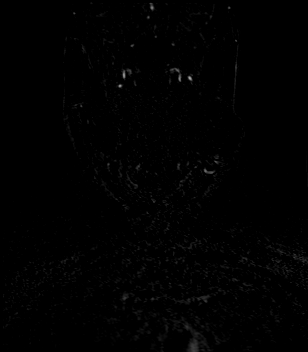
[im 10/92]
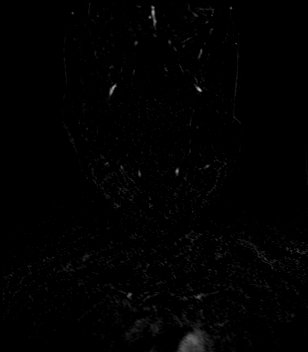
[im 15/92]
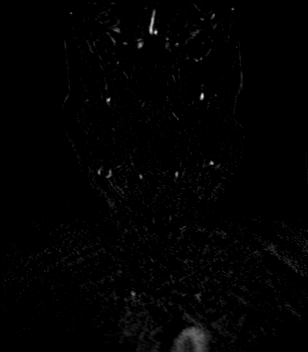
[im 20/92]
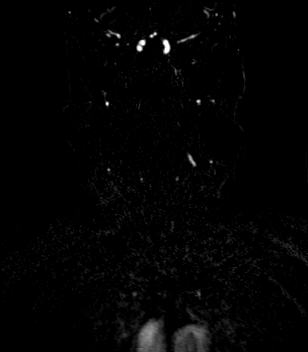
[im 24/92]
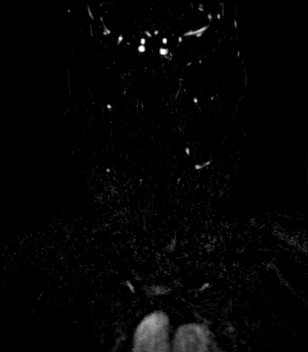
[im 29/92]
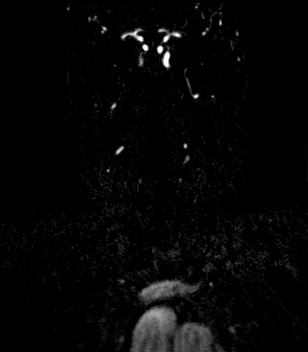
[im 34/92]
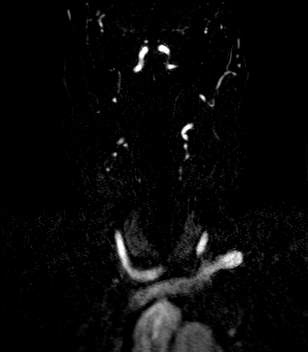
[im 39/92]
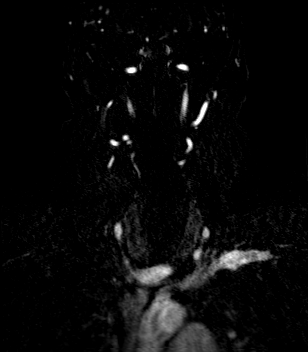
[im 44/92]
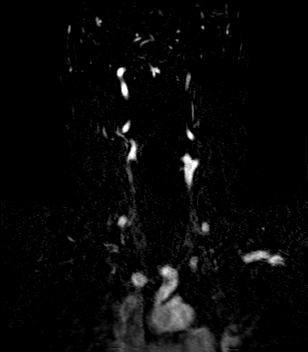
[im 48/92]
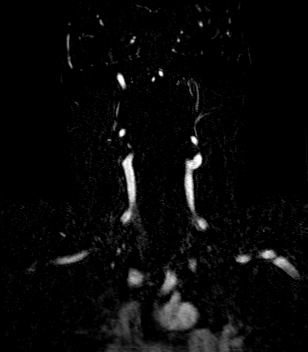
[im 53/92]
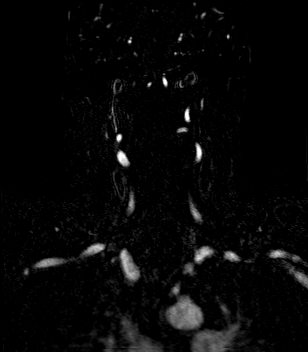
[im 58/92]
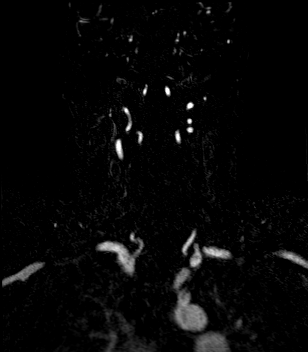
[im 63/92]
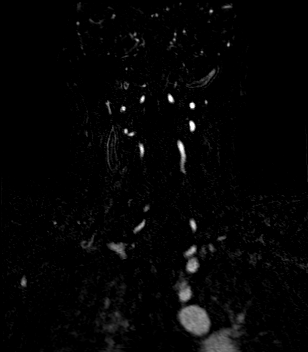
[im 68/92]
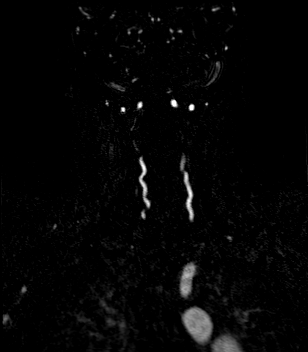
[im 72/92]
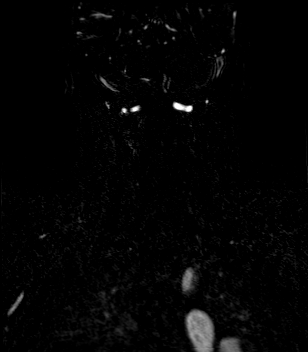
[im 77/92]
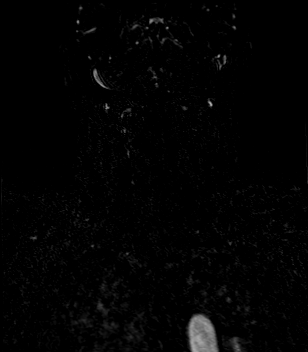
[im 82/92]
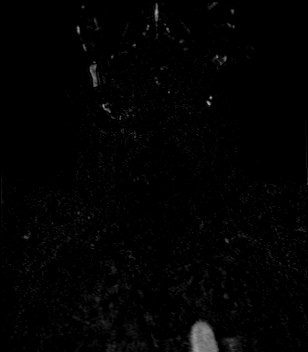
[im 87/92]
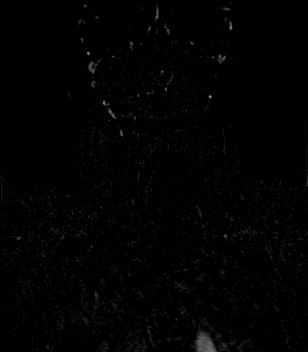
[im 92/92]
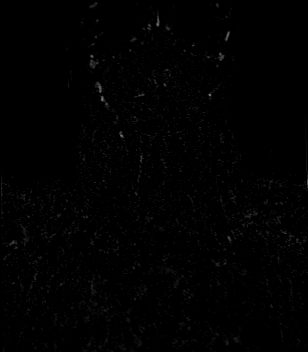

[20 of 48 positions shown; findings below may reference images not displayed]

FINDINGS: MRI HEAD FINDINGS

Brain: Area of diffusion abnormality the right precentral sulcus,
likely subacute ischemia. No acute or chronic hemorrhage. There is
multifocal hyperintense T2-weighted signal within the white matter.
Generalized cerebral volume loss. The midline structures are normal.

Vascular: Major flow voids are preserved.

Skull and upper cervical spine: Normal calvarium and skull base.
Visualized upper cervical spine and soft tissues are normal.

Sinuses/Orbits:No paranasal sinus fluid levels or advanced mucosal
thickening. No mastoid or middle ear effusion. Normal orbits.

MRA HEAD FINDINGS

POSTERIOR CIRCULATION:

--Vertebral arteries: Normal

--Inferior cerebellar arteries: Normal.

--Basilar artery: Normal.

--Superior cerebellar arteries: Normal.

--Posterior cerebral arteries: Mild narrowing of the proximal left
P2 segment.

ANTERIOR CIRCULATION:

--Intracranial internal carotid arteries: Normal.

--Anterior cerebral arteries (ACA): Normal.

--Middle cerebral arteries (MCA): Normal.

MRA NECK FINDINGS

Aortic arch: Normal branching pattern

Right carotid system: Normal.  No stenosis.

Left carotid system: Normal.  No stenosis.

Vertebral arteries: Left dominant.  Normal.

Other: None
IMPRESSION: 1. Small focus of subacute ischemia within the right precentral
sulcus. No hemorrhage or mass effect.
2. Normal intracranial MRA.
3. Normal MRA of the neck.

## 2021-11-06 MED ORDER — SODIUM CHLORIDE 0.9% FLUSH
3.0000 mL | Freq: Once | INTRAVENOUS | Status: AC
  Administered 2021-11-06: 3 mL via INTRAVENOUS

## 2021-11-06 MED ORDER — APIXABAN 5 MG PO TABS: 5.0000 mg | ORAL_TABLET | Freq: Two times a day (BID) | ORAL | 0 refills | Status: DC

## 2021-11-06 NOTE — Progress Notes (Signed)
Code stroke activated ?

## 2021-11-06 NOTE — ED Notes (Signed)
Pt cleared for CT by Dr York Cerise ?

## 2021-11-06 NOTE — ED Notes (Signed)
Called [facility] to initiate Code Stroke @2320  spoke who will activate page  ?

## 2021-11-06 NOTE — Progress Notes (Signed)
?  ?Cardiology Office Note ? ? ?Date:  11/06/2021  ? ?ID:  Jeffery Beltran, DOB 07/02/1941, MRN [id number] ? ?PCP:  Kandyce Rud, MD  ?Cardiologist:   Lorine Bears, MD  ? ?Chief Complaint  ?Patient presents with  ? Other  ?  F/u monitor results no complaints today. Meds reviewed verbally with pt.  ? ? ?  ?History of Present Illness: ?Jeffery Beltran is a 81 y.o. male who presents for evaluation of newly diagnosed atrial fibrillation. ?He has past medical history of essential hypertension, hyperlipidemia, type 2 diabetes and obesity.  He was hospitalized earlier this month with acute onset of incoordination of his left hand.  Symptoms resolved quickly and he was diagnosed with TIA.  Brain imaging showed cortical diffusion restriction in the right perirolandic sulcus.  The location was suggestive of an embolic etiology.  Echocardiogram showed normal LV systolic function with mild mitral regurgitation and no evidence of PFO.  He was discharged home on aspirin and clopidogrel.  He had outpatient ZIO monitor done which showed paroxysmal atrial fibrillation.  The longest episode lasted 15 hours.  He reports no symptoms related to atrial fibrillation whatsoever.  No chest pain, shortness of breath or palpitations.  He is very active and swims 1 mile 3 times a week.  He also does his yard work.  He has been diabetic for at least 30 years and was recently placed on Farxiga given mild chronic kidney disease. ? ? ?Past Medical History:  ?Diagnosis Date  ? Diabetes (HCC)   ? Glaucoma   ? Hypertension   ? Stroke [facility])   ? ? ?Past Surgical History:  ?Procedure Laterality Date  ? CATARACT EXTRACTION Right   ? EYE SURGERY Right   ? Cat Sx  ? EYE SURGERY Right   ? Trab  ? IRIDOTOMY / IRIDECTOMY Right   ? Trab  ? TRABECULECTOMY Right   ? YAG LASER APPLICATION Right   ? ? ? ?Current Outpatient Medications  ?Medication Sig Dispense Refill  ? apixaban (ELIQUIS) 5 MG TABS tablet Take 1 tablet (5 mg total) by mouth 2 (two) times  daily. 60 tablet 11  ? AZOPT 1 % ophthalmic suspension Place 1 drop into both eyes 2 (two) times daily.    ? brimonidine (ALPHAGAN P) 0.1 % SOLN Place 1 drop into both eyes 2 (two) times daily.    ? Continuous Blood Gluc Receiver (DEXCOM G6 RECEIVER) DEVI     ? Continuous Blood Gluc Sensor (DEXCOM G6 SENSOR) MISC SMARTSIG:1 Each Topical Every 10 Days    ? Continuous Blood Gluc Transmit (DEXCOM G6 TRANSMITTER) MISC     ? Dapagliflozin Propanediol (FARXIGA PO) Take 10 mg/day by mouth daily.    ? ezetimibe (ZETIA) 10 MG tablet Take 1 tablet (10 mg total) by mouth daily. 30 tablet 0  ? HUMALOG KWIKPEN 100 UNIT/ML KiwkPen Inject 30 Units into the skin 3 (three) times daily.    ? Insulin Glargine 300 UNIT/ML SOPN Inject 16-17 Units into the skin daily after supper.    ? latanoprost (XALATAN) 0.005 % ophthalmic solution Place 1 drop into both eyes at bedtime.     ? Loratadine 10 MG CAPS Take by mouth.    ? losartan (COZAAR) 100 MG tablet Take by mouth.    ? metFORMIN (GLUCOPHAGE-XR) 500 MG 24 hr tablet 1,000 mg daily after supper.    ? Multiple Vitamins-Minerals (CENTRUM SILVER 50+MEN) TABS Take 1 tablet by mouth daily.    ? nebivolol (BYSTOLIC) 5 MG  tablet Take 1 tablet (5 mg total) by mouth daily. Hold this medication until you see your PCP and/or cardiologist. HR has been on lower end 30 tablet   ? NOVOFINE 32G X 6 MM MISC     ? ONE TOUCH ULTRA TEST test strip     ? simvastatin (ZOCOR) 20 MG tablet Take 20 mg by mouth at bedtime.  5  ? triamterene-hydrochlorothiazide (MAXZIDE-25) 37.5-25 MG tablet Take 1 tablet by mouth daily.    ? TRULICITY 0.75 MG/0.5ML SOPN Inject into the skin once a week.    ? ?No current facility-administered medications for this visit.  ? ? ?Allergies:   Patient has no known allergies.  ? ? ?Social History:  The patient  reports that he has never smoked. He has never used smokeless tobacco. He reports current alcohol use. He reports that he does not use drugs.  ? ?Family History:  The patient's  family history is negative for premature coronary artery disease. ? ? ?ROS:  Please see the history of present illness.   Otherwise, review of systems are positive for none.   All other systems are reviewed and negative.  ? ? ?PHYSICAL EXAM: ?VS:  BP 128/74 (BP Location: Right Arm, Patient Position: Sitting, Cuff Size: Normal)   Pulse (!) 55   Ht 5\' 10"  (1.778 m)   Wt 237 lb 8 oz (107.7 kg)   SpO2 98%   BMI 34.08 kg/m?  , BMI Body mass index is 34.08 kg/m?. ?GEN: Well nourished, well developed, in no acute distress  ?HEENT: normal  ?Neck: no JVD, carotid bruits, or masses ?Cardiac: RRR; no murmurs, rubs, or gallops,no edema  ?Respiratory:  clear to auscultation bilaterally, normal work of breathing ?GI: soft, nontender, nondistended, + BS ?MS: no deformity or atrophy  ?Skin: warm and dry, no rash ?Neuro:  Strength and sensation are intact ?Psych: euthymic mood, full affect ? ? ?EKG:  EKG is ordered today. ?The ekg ordered today demonstrates sinus bradycardia with nonspecific T wave changes. ? ? ?Recent Labs: ?10/11/2021: ALT 13; TSH 3.119 ?10/12/2021: BUN 27; Creatinine, Ser 1.32; Hemoglobin 12.4; Platelets 200; Potassium 3.5; Sodium 137  ? ? ?Lipid Panel ?   ?Component Value Date/Time  ? CHOL 137 10/11/2021 0249  ? TRIG 75 10/11/2021 0249  ? HDL 41 10/11/2021 0249  ? CHOLHDL 3.3 10/11/2021 0249  ? VLDL 15 10/11/2021 0249  ? LDLCALC 81 10/11/2021 0249  ? ?  ? ?Wt Readings from Last 3 Encounters:  ?11/06/21 237 lb 8 oz (107.7 kg)  ?10/11/21 248 lb 10.9 oz (112.8 kg)  ?  ? ? ? ? ?  11/06/2021  ?  3:21 PM  ?PAD Screen  ?Previous PAD dx? No  ?Previous surgical procedure? No  ?Pain with walking? No  ?Feet/toe relief with dangling? No  ?Painful, non-healing ulcers? No  ?Extremities discolored? No  ? ? ? ? ?ASSESSMENT AND PLAN: ? ?1.  Paroxysmal atrial fibrillation: I suspect this is the likely etiology for the patient's recent ischemic stroke.  Fortunately, he does not have symptoms related to atrial fibrillation.  His  CHA2DS2-VASc score is 6 and thus he is at very high risk for thromboembolic complications related to atrial fibrillation.  He was started on Eliquis 5 mg twice daily.  Dual antiplatelet therapy with aspirin and clopidogrel were discontinued.  Given lack of symptoms, no need for an antiarrhythmic medication. ? ?2.  Essential hypertension: Blood pressure is well controlled on current medications. ? ?3.  Hyperlipidemia: He reports  abnormal liver enzymes with atorvastatin in the past but he has been on simvastatin with no issues and most recently, ezetimibe was added. ? ? ? ?Disposition:   FU in 6 months. ? ?Signed, ? ?Lorine Bears, MD  ?11/06/2021 3:33 PM    ?[facility] ?

## 2021-11-06 NOTE — ED Notes (Signed)
FSBS 151

## 2021-11-06 NOTE — ED Notes (Addendum)
Pt to room 16 via w/c; teleneuro at bedside; pt assisted into hosp gown and on card monitor; care nurse Reino Kent RN at bedside ?

## 2021-11-06 NOTE — ED Triage Notes (Signed)
Pt to triage via w/c; reports at 1045pm while getting ready for bed, had onset weakness to left arm/hand ?

## 2021-11-06 NOTE — Progress Notes (Signed)
Dr. Maryan Rued joined teleneuro cart ?

## 2021-11-06 NOTE — ED Notes (Signed)
Pt able to transfer self to CT table without difficulty or c/o dizziness ?

## 2021-11-06 NOTE — Patient Instructions (Signed)
Medication Instructions:  ?Your physician recommends that you continue on your current medications as directed. Please refer to the Current Medication list given to you today. ? ? ?An Rx for Eliquis has been sent to [facility] ? ?*If you need a refill on your cardiac medications before your next appointment, please call your pharmacy* ? ? ?Lab Work: ?None ordered ?If you have labs (blood work) drawn today and your tests are completely normal, you will receive your results only by: ?MyChart Message (if you have MyChart) OR ?A paper copy in the mail ?If you have any lab test that is abnormal or we need to change your treatment, we will call you to review the results. ? ? ?Testing/Procedures: ?None ordered ? ? ?Follow-Up: ?At [facility], you and your health needs are our priority.  As part of our continuing mission to provide you with exceptional heart care, we have created designated Provider Care Teams.  These Care Teams include your primary Cardiologist (physician) and Advanced Practice Providers (APPs -  Physician Assistants and Nurse Practitioners) who all work together to provide you with the care you need, when you need it. ? ?We recommend signing up for the patient portal called "MyChart".  Sign up information is provided on this After Visit Summary.  MyChart is used to connect with patients for Virtual Visits (Telemedicine).  Patients are able to view lab/test results, encounter notes, upcoming appointments, etc.  Non-urgent messages can be sent to your provider as well.   ?To learn more about what you can do with MyChart, go to [email or web address].   ? ?Your next appointment:   ?6 month(s) ? ?The format for your next appointment:   ?In Person ? ?Provider:   ?You may see Kathlyn Sacramento, MD or one of the following Advanced Practice Providers on your designated Care Team:   ?Murray Hodgkins, NP ?Christell Faith, PA-C ?Cadence Kathlen Mody, PA-C{ ? ? ? ?Other Instructions ?N/A ? ?Important Information About  [employer] ? ? ? ? ? ? ?

## 2021-11-06 NOTE — Progress Notes (Signed)
Pt back to room after CT

## 2021-11-06 NOTE — Consult Note (Addendum)
[facility] ?[facility] TeleNeurology Consult Services ? ? ?Patient Name:   Jeffery Beltran, Jeffery Beltran ?Date of Birth:   [birth year] ?Identification Number:   MRN - [id number] ?Date of Service:   11/06/2021 23:20:08 ? ?Diagnosis: ?      I63.9 - Cerebrovascular accident (CVA), unspecified mechanism ([place]) ? ?Impression: ?     Patient presents with recurrence of left upper extremity coordination. Symptoms similar to previous hospital admission at which point time he had a small acute CVA in the right frontal lobe. No thrombolytics due to stroke within the past 3 months as well as Eliquis use. Differential includes recrudescence of previous stroke symptoms versus new acute ischemic stroke. Recommend tox metabolic work-up per the primary team, MRI brain without contrast and MRA h/n to definitively rule out acute CVA, and neuro follow-up. If he does have a new stroke on MRI then we will need to complete further neurovascular work-up including echocardiogram, vascular imaging, etc. Neuro follow-up recommended. ? ?Update: Patient had a MRI of the brain without contrast as well as MR angiogram head and neck.  Upon reviewing the MRI of the brain without contrast it does appear that he has a small subtle area of diffusion restriction near the right central sulcus.  This appears to be different than his previous MRI last month where he had diffusion restriction much more anterior.  It does appear that he has had a subsequent stroke.  I would therefore recommend he be admitted to the hospital for further neurovascular work-up including an echocardiogram.  Therapy evaluations.  He will need neurology follow-up.  I would recommend continuation of his anticoagulation.  Recommendations relayed to ER physician over the phone. ? ?Sign Out: ?      Discussed with Emergency Department Provider ? ? ? ?------------------------------------------------------------------------------ ? ?Advanced Imaging: ?Advanced Imaging Deferred  because: ? ?does not meet criteria due to NIHSS <6 and no cortical signs ? ? ?Metrics: ?Last Known Well: 11/06/2021 22:45:00 ?[facility] Notification Time: 11/06/2021 23:20:08 ?Arrival Time: 11/06/2021 23:06:00 ?Stamp Time: 11/06/2021 23:20:08 ?Initial Response Time: 11/06/2021 23:25:48 ?Symptoms: Left upper extremity incoordination. ?NIHSS Start Assessment Time: 11/06/2021 23:28:29 ?Patient is not a candidate for Thrombolytic. ?Thrombolytic Medical Decision: 11/06/2021 23:28:31 ?Patient was not deemed candidate for Thrombolytic because of following reasons: ?Use of NOAs within 48 hours. ?Stroke in last 3 months. ? ?I personally Reviewed the CT Head and it Showed no acute abnormalities ? ?Primary Provider Notified of Diagnostic Impression and Management Plan on: 11/06/2021 23:38:30 ? ? ? ?------------------------------------------------------------------------------ ? ?History of Present Illness: ?Patient is a 81 year old Male. ? ?Patient was brought by private transportation with symptoms of Left upper extremity incoordination. 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 Reports that his symptoms completely resolved after his last hospital stay. This evening at around 10:45 PM he began having recurrence of similar symptoms of left hand weakness and incoordination. Symptoms are very reminiscent of the symptoms that he had about a month ago. Has been on Eliquis for the past 4 days or so that was recently started. Exam reveals some ataxia in the left upper extremity but the rest of his exam is nonfocal. ? ? ?Past Medical History: ?Othere PMH:  See HPI Above ? ?Medications: ? ?Anticoagulant use:  Yes eliquis ?No Antiplatelet use ?Reviewed EMR  for  current medications ? ?Allergies:  ?Reviewed ? ?Social History: ?Drug Use: No ? ?Family History: ? ?There is no family history of premature cerebrovascular disease pertinent to this consultation ? ?ROS : ?14 Points Review of Systems was performed and was negative except mentioned in HPI. ? ?Past Surgical History: ?There Is No Surgical History Contributory To Today?s Visit ? ? ? ?Examination: ?BP(167/69), Pulse(59), Blood Glucose(151) ?1A: Level of Consciousness - Alert; keenly responsive + 0 ?1B: Ask Month and Age - Both Questions Right + 0 ?1C: Blink Eyes & Squeeze Hands - Performs Both Tasks + 0 ?2: Test Horizontal Extraocular Movements - Normal + 0 ?3: Test Visual Fields - No Visual Loss + 0 ?4: Test Facial Palsy (Use Grimace if Obtunded) - Normal symmetry + 0 ?5A: Test Left Arm Motor Drift - No Drift for 10 Seconds + 0 ?5B: Test Right Arm Motor Drift - No Drift for 10 Seconds + 0 ?6A: Test Left Leg Motor Drift - No Drift for 5 Seconds + 0 ?6B: Test Right Leg Motor Drift - No Drift for 5 Seconds + 0 ?7: Test Limb Ataxia (FNF/Heel-Shin) - Ataxia in 1 Limb + 1 ?8: Test Sensation - Normal; No sensory loss + 0 ?9: Test Language/Aphasia - Normal; No aphasia + 0 ?10: Test Dysarthria - Normal + 0 ?11: Test Extinction/Inattention - No abnormality + 0 ? ?NIHSS Score: 1 ? ? ?Pre-Morbid Modified Rankin Scale: ?0 Points = No symptoms at all ? ? ?Patient/Family was informed the Neurology Consult would occur via TeleHealth consult by way of interactive audio and video telecommunications and consented to receiving care in this manner. ? ? ?Patient is being evaluated for possible acute neurologic impairment and high probability of imminent or life-threatening deterioration. I spent total of 35 minutes providing care to this patient, including time for face to face visit via telemedicine, review of medical records, imaging studies and discussion of findings with providers, the patient and/or family. ? ? ?Dr Knox Royalty ? ? ?[facility] ?[phone] ? ? ?Case [id number] ? ?

## 2021-11-07 ENCOUNTER — Other Ambulatory Visit: Payer: Self-pay | Admitting: Cardiovascular Disease

## 2021-11-07 ENCOUNTER — Other Ambulatory Visit: Payer: Self-pay

## 2021-11-07 DIAGNOSIS — I639 Cerebral infarction, unspecified: Secondary | ICD-10-CM | POA: Diagnosis not present

## 2021-11-07 DIAGNOSIS — I480 Paroxysmal atrial fibrillation: Secondary | ICD-10-CM

## 2021-11-07 DIAGNOSIS — E119 Type 2 diabetes mellitus without complications: Secondary | ICD-10-CM

## 2021-11-07 DIAGNOSIS — I10 Essential (primary) hypertension: Secondary | ICD-10-CM

## 2021-11-07 DIAGNOSIS — N1831 Chronic kidney disease, stage 3a: Secondary | ICD-10-CM

## 2021-11-07 LAB — CBG MONITORING, ED
Glucose-Capillary: 125 mg/dL — ABNORMAL HIGH (ref 70–99)
Glucose-Capillary: 159 mg/dL — ABNORMAL HIGH (ref 70–99)
Glucose-Capillary: 194 mg/dL — ABNORMAL HIGH (ref 70–99)
Glucose-Capillary: 207 mg/dL — ABNORMAL HIGH (ref 70–99)

## 2021-11-07 LAB — LIPID PANEL
Cholesterol: 91 mg/dL (ref 0–200)
HDL: 38 mg/dL — ABNORMAL LOW (ref >40)
LDL Cholesterol: 35 mg/dL (ref 0–99)
Total CHOL/HDL Ratio: 2.4 RATIO
Triglycerides: 92 mg/dL (ref <150)
VLDL: 18 mg/dL (ref 0–40)

## 2021-11-07 MED ORDER — INSULIN GLARGINE-YFGN 100 UNIT/ML ~~LOC~~ SOLN
16.0000 [IU] | Freq: Every day | SUBCUTANEOUS | Status: DC
  Administered 2021-11-07: 16 [IU] via SUBCUTANEOUS
  Filled 2021-11-07: qty 0.16

## 2021-11-07 MED ORDER — INSULIN GLARGINE-YFGN 100 UNIT/ML ~~LOC~~ SOLN: 16.0000 [IU] | Freq: Every day | SUBCUTANEOUS | Status: DC

## 2021-11-07 MED ORDER — INSULIN ASPART 100 UNIT/ML IJ SOLN
0.0000 [IU] | INTRAMUSCULAR | Status: DC
  Administered 2021-11-07: 3 [IU] via SUBCUTANEOUS
  Filled 2021-11-07: qty 1

## 2021-11-07 MED ORDER — ACETAMINOPHEN 160 MG/5ML PO SOLN
650.0000 mg | ORAL | Status: DC | PRN
  Filled 2021-11-07: qty 20.3

## 2021-11-07 MED ORDER — SODIUM CHLORIDE 0.9 % IV SOLN: INTRAVENOUS | Status: DC

## 2021-11-07 MED ORDER — GADOBUTROL 1 MMOL/ML IV SOLN
10.0000 mL | Freq: Once | INTRAVENOUS | Status: AC | PRN
  Administered 2021-11-07: 10 mL via INTRAVENOUS

## 2021-11-07 MED ORDER — INSULIN ASPART 100 UNIT/ML IJ SOLN
0.0000 [IU] | Freq: Three times a day (TID) | INTRAMUSCULAR | Status: DC
  Administered 2021-11-07: 5 [IU] via SUBCUTANEOUS
  Filled 2021-11-07: qty 1

## 2021-11-07 MED ORDER — STROKE: EARLY STAGES OF RECOVERY BOOK: Freq: Once | Status: DC

## 2021-11-07 MED ORDER — ACETAMINOPHEN 325 MG RE SUPP: 650.0000 mg | RECTAL | Status: DC | PRN

## 2021-11-07 MED ORDER — SODIUM CHLORIDE 0.9 % IV BOLUS
500.0000 mL | Freq: Once | INTRAVENOUS | Status: AC
  Administered 2021-11-07: 500 mL via INTRAVENOUS

## 2021-11-07 MED ORDER — APIXABAN 5 MG PO TABS
5.0000 mg | ORAL_TABLET | Freq: Two times a day (BID) | ORAL | Status: DC
  Administered 2021-11-07: 5 mg via ORAL
  Filled 2021-11-07: qty 1

## 2021-11-07 MED ORDER — NEBIVOLOL HCL 5 MG PO TABS: 2.5000 mg | ORAL_TABLET | Freq: Every day | ORAL | Status: DC

## 2021-11-07 MED ORDER — ACETAMINOPHEN 325 MG PO TABS: 650.0000 mg | ORAL_TABLET | ORAL | Status: DC | PRN

## 2021-11-07 NOTE — Assessment & Plan Note (Signed)
Basal insulin with sliding scale coverage 

## 2021-11-07 NOTE — ED Notes (Signed)
Pt just finished eating from breakfast tray; pt ate 100%.  ?

## 2021-11-07 NOTE — Consult Note (Signed)
Neurology Consultation ? ?Reason for Consult: recurrent stroke ?Referring Physician: Dr Ouida Sills, hospitalist ? ?CC: Left-sided incoordination ? ?History is obtained from: Patient, chart ? ?HPI: Jeffery Beltran is a 81 y.o. male with a recent right hemispheric cortical looking infarct and ensuing work-up revealing new onset atrial fibrillation for which she started Eliquis just a few days ago diabetes, hypertension presented to the emergency room for arrival of similar symptoms from a large stroke 4 weeks ago-left hand incoordination and weakness.  Last known well around 10:45 PM yesterday.  Evaluated by telemedicine neurology. ?Not a candidate for thrombolysis due to being on anticoagulation ?Exam not consistent with LVO-hence repeat head and neck vessel imaging not performed.  No emergent finding on the CTA head and neck from 10/11/2021-only showed intracranial atherosclerosis. ?He was sent home on cardiac monitoring which revealed atrial fibrillation.  He was started on Eliquis 4 days ago and reports compliance to medication. ? ?LKW: 10:45 PM 11/07/2018 ?tpa given?: no, on Eliquis ?Premorbid modified Rankin scale (mRS): 1 ? ?ROS: Full ROS was performed and is negative except as noted in the HPI.  ? ?Past Medical History:  ?Diagnosis Date  ? Diabetes ([place])   ? Glaucoma   ? Hypertension   ? Stroke [facility])   ? ?Family History  ?Problem Relation Age of Onset  ? Amblyopia Neg Hx   ? Blindness Neg Hx   ? Cataracts Neg Hx   ? Glaucoma Neg Hx   ? Macular degeneration Neg Hx   ? Retinal detachment Neg Hx   ? Strabismus Neg Hx   ? Retinitis pigmentosa Neg Hx   ? ? ? ?Social History:  ? reports that he has never smoked. He has never used smokeless tobacco. He reports current alcohol use. He reports that he does not use drugs. ? ?Medications ? ?Current Facility-Administered Medications:  ?   stroke: early stages of recovery book, , Does not apply, Once, Athena Masse, MD ?  acetaminophen (TYLENOL) tablet 650 mg, 650 mg, Oral,  Q4H PRN **OR** acetaminophen (TYLENOL) 160 MG/5ML solution 650 mg, 650 mg, Per Tube, Q4H PRN **OR** acetaminophen (TYLENOL) suppository 650 mg, 650 mg, Rectal, Q4H PRN, Athena Masse, MD ?  apixaban Arne [place]) tablet 5 mg, 5 mg, Oral, BID, Athena Masse, MD, 5 mg at 11/07/21 [id number] ?  insulin aspart (novoLOG) injection 0-15 Units, 0-15 Units, Subcutaneous, TID WC, Richarda Osmond, MD ?  insulin glargine-yfgn (SEMGLEE) injection 16 Units, 16 Units, Subcutaneous, Daily, Richarda Osmond, MD, 16 Units at 11/07/21 1059 ? ?Current Outpatient Medications:  ?  apixaban (ELIQUIS) 5 MG TABS tablet, Take 1 tablet (5 mg total) by mouth 2 (two) times daily., Disp: 180 tablet, Rfl: 0 ?  AZOPT 1 % ophthalmic suspension, Place 1 drop into both eyes 2 (two) times daily., Disp: , Rfl:  ?  brimonidine (ALPHAGAN P) 0.1 % SOLN, Place 1 drop into both eyes 2 (two) times daily., Disp: , Rfl:  ?  Dapagliflozin Propanediol (FARXIGA PO), Take 10 mg/day by mouth daily., Disp: , Rfl:  ?  ezetimibe (ZETIA) 10 MG tablet, Take 1 tablet (10 mg total) by mouth daily., Disp: 30 tablet, Rfl: 0 ?  HUMALOG KWIKPEN 100 UNIT/ML KiwkPen, Inject 22-24 Units into the skin 3 (three) times daily., Disp: , Rfl:  ?  Insulin Glargine 300 UNIT/ML SOPN, Inject 16-17 Units into the skin daily after supper., Disp: , Rfl:  ?  latanoprost (XALATAN) 0.005 % ophthalmic solution, Place 1 drop into both eyes at  bedtime. , Disp: , Rfl:  ?  Loratadine 10 MG CAPS, Take 10 mg by mouth daily at 12 noon., Disp: , Rfl:  ?  losartan (COZAAR) 100 MG tablet, Take 100 mg by mouth daily., Disp: , Rfl:  ?  metFORMIN (GLUCOPHAGE-XR) 500 MG 24 hr tablet, 1,000 mg daily after supper., Disp: , Rfl:  ?  Multiple Vitamins-Minerals (CENTRUM SILVER 50+MEN) TABS, Take 1 tablet by mouth daily., Disp: , Rfl:  ?  nebivolol (BYSTOLIC) 5 MG tablet, Take 1 tablet (5 mg total) by mouth daily. Hold this medication until you see your PCP and/or cardiologist. HR has been on lower end (Patient  taking differently: Take 2.5 mg by mouth daily. Hold this medication until you see your PCP and/or cardiologist. HR has been on lower end), Disp: 30 tablet, Rfl:  ?  simvastatin (ZOCOR) 20 MG tablet, Take 20 mg by mouth at bedtime., Disp: , Rfl: 5 ?  triamterene-hydrochlorothiazide (MAXZIDE-25) 37.5-25 MG tablet, Take 1 tablet by mouth daily., Disp: , Rfl:  ?  TRULICITY [id number] SOPN, Inject 0.75 mg into the skin every Sunday., Disp: , Rfl:  ?  Continuous Blood Gluc Receiver (DEXCOM G6 RECEIVER) DEVI, , Disp: , Rfl:  ?  Continuous Blood Gluc Sensor (DEXCOM G6 SENSOR) MISC, SMARTSIG:1 Each Topical Every 10 Days, Disp: , Rfl:  ?  Continuous Blood Gluc Transmit (DEXCOM G6 TRANSMITTER) MISC, , Disp: , Rfl:  ?  NOVOFINE 32G X 6 MM MISC, , Disp: , Rfl:  ?  ONE TOUCH ULTRA TEST test strip, , Disp: , Rfl:  ? ? ?Exam: ?Current vital signs: ?BP (!) 149/71   Pulse 61   Temp 98.1 ?F (36.7 ?C) (Oral)   Resp 18   Ht 5\' 10"  (1.778 m)   Wt 108.8 kg   SpO2 96%   BMI 34.42 kg/m?  ?Vital signs in last 24 hours: ?Temp:  [98.1 ?F (36.7 ?C)] 98.1 ?F (36.7 ?C) (05/02 2330) ?Pulse Rate:  [55-75] 61 (05/03 1100) ?Resp:  [16-18] 18 (05/03 1102) ?BP: (128-154)/(62-74) 149/71 (05/03 1100) ?SpO2:  [94 %-98 %] 96 % (05/03 1100) ?Weight:  [107.7 kg-108.8 kg] 108.8 kg (05/02 2320) ? ?GENERAL: Awake, alert in NAD ?HEENT: - Normocephalic and atraumatic, dry mm, no LN++, no Thyromegally ?LUNGS - Clear to auscultation bilaterally with no wheezes ?CV - S1S2 RRR, no m/r/g, equal pulses bilaterally. ?ABDOMEN - Soft, nontender, nondistended with normoactive BS ?Ext: warm, well perfused, intact peripheral pulses, no edema ? ?NEURO:  ?Mental Status: AA&Ox3  ?Language: speech is nondysarthric.  Naming, repetition, fluency, and comprehension intact. ?Cranial Nerves: PERRL EOMI, visual fields full, no facial asymmetry,facial sensation intact, hearing intact, tongue/uvula/soft palate midline, normal sternocleidomastoid and trapezius muscle strength.  No evidence of tongue atrophy or fibrillations ?Motor: Not vertical drift in any of the 4 extremities but there is distal muscle weakness in the left hand and slow finger taps. ?Tone: is normal and bulk is normal ?Sensation- Intact to light touch bilaterally ?Coordination: Mild ataxia on finger-nose testing on the left ?Gait- deferred ? ? ?NIH stroke scale 1 for ataxia ? ? ?Labs ?I have reviewed labs in epic and the results pertinent to this consultation are: ? ?CBC ?   ?Component Value Date/Time  ? WBC 8.5 11/06/2021 2318  ? RBC 4.82 11/06/2021 2318  ? HGB 13.4 11/06/2021 2318  ? HCT 42.3 11/06/2021 2318  ? PLT 223 11/06/2021 2318  ? MCV 87.8 11/06/2021 2318  ? MCH 27.8 11/06/2021 2318  ? MCHC 31.7 11/06/2021  2318  ? RDW 13.3 11/06/2021 2318  ? LYMPHSABS 2.1 11/06/2021 2318  ? MONOABS 0.8 11/06/2021 2318  ? EOSABS 0.2 11/06/2021 2318  ? BASOSABS 0.1 11/06/2021 2318  ? ? ?CMP  ?   ?Component Value Date/Time  ? NA 137 11/06/2021 2318  ? K 3.6 11/06/2021 2318  ? CL 99 11/06/2021 2318  ? CO2 28 11/06/2021 2318  ? GLUCOSE 159 (H) 11/06/2021 2318  ? BUN 37 (H) 11/06/2021 2318  ? CREATININE 1.67 (H) 11/06/2021 2318  ? CALCIUM 9.7 11/06/2021 2318  ? PROT 7.5 11/06/2021 2318  ? ALBUMIN 4.0 11/06/2021 2318  ? AST 20 11/06/2021 2318  ? ALT 18 11/06/2021 2318  ? ALKPHOS 79 11/06/2021 2318  ? BILITOT 0.6 11/06/2021 2318  ? GFRNONAA 41 (L) 11/06/2021 2318  ? ? ?Lipid Panel  ?   ?Component Value Date/Time  ? CHOL 91 11/07/2021 0456  ? TRIG 92 11/07/2021 0456  ? HDL 38 (L) 11/07/2021 0456  ? CHOLHDL 2.4 11/07/2021 0456  ? VLDL 18 11/07/2021 0456  ? [place] 35 11/07/2021 0456  ? ? ? ?Imaging ?I have reviewed the images obtained: ? ?CT-head-no acute changes ? ?MRI examination of the brain-small focus of subacute ischemia in the right precentral sulcus more posteriorly than the prior stroke from a month ago.  Possibly hand knob area infarction. ? ?Assessment: 81 year old with recent right hemispheric cortical embolic appearing  stroke, work-up of which yielded new onset atrial fibrillation on outpatient monitoring, started Eliquis 3 to 4 days ago, returns back with similar symptoms of left arm/hand incoordination.  New cortical stroke in

## 2021-11-07 NOTE — Telephone Encounter (Signed)
Please review

## 2021-11-07 NOTE — Assessment & Plan Note (Signed)
At baseline 

## 2021-11-07 NOTE — ED Notes (Signed)
Pt changing clothes. Will assist to vehicle momentarily.  ?

## 2021-11-07 NOTE — ED Notes (Signed)
Pt returned from MRI at this time. Denies needs or c/o. Family at bedside.  ?

## 2021-11-07 NOTE — Evaluation (Signed)
Occupational Therapy Evaluation ?Patient Details ?Name: Jeffery Beltran ?MRN: [id number] ?DOB: 04/13/1941 ?Today's Date: 11/07/2021 ? ? ?History of Present Illness Jeffery Beltran is a 81 y.o. male with medical history significant for DM, HTN, paroxysmal atrial fibrillation, started on Eliquis 4 days prior, recent CVA 4/6 affecting left upper extremity who presents to the ED as a code stroke after presenting with numbness to the same left upper extremity with symptoms. MRI Results showed a small focus of subacute ischemia within the right precentral sulcus without hemorrhage or mass effect.  ? ?Clinical Impression ?  ?Jeffery Beltran was seen for OT evaluation this date. Prior to hospital admission, pt was Independent for mobility and all I/ADLs. Pt demonstrates impaired use of non-dominant LUE. LUE 4-/5 grossly, impaired FNF and opposition. Intermittently drops objects in L hand when not attending to task. MOD I don/doff B socks seated in chair. Independent mobility including collecting trash from floor. SETUP self-feeding and grooming  - assist to open packages. Green theraputty provided, initiated HEP however MD in to room, will review next session. Completed [place] activity opening tissue box, crumpling, translating to fingertips, throwing tissue at trash can, and picking up trash from floor. Pt will benefit from Outpatient OT services upon discharge.    ? ?Recommendations for follow up therapy are one component of a multi-disciplinary discharge planning process, led by the attending physician.  Recommendations may be updated based on patient status, additional functional criteria and insurance authorization.  ? ?Follow Up Recommendations ? Outpatient OT  ?  ?Assistance Recommended at Discharge Set up Supervision/Assistance  ?Patient can return home with the following A little help with bathing/dressing/bathroom ? ?  ?Functional Status Assessment ? Patient has had a recent decline in their functional status and  demonstrates the ability to make significant improvements in function in a reasonable and predictable amount of time.  ?Equipment Recommendations ? None recommended by OT  ?  ?Recommendations for Other Services   ? ? ?  ?Precautions / Restrictions Precautions ?Precautions: None ?Restrictions ?Weight Bearing Restrictions: No  ? ?  ? ?Mobility Bed Mobility ?Overal bed mobility: Independent ?  ?  ?  ?  ?  ?  ?  ?  ? ?Transfers ?Overall transfer level: Independent ?  ?  ?  ?  ?  ?  ?  ?  ?  ?  ? ?  ?Balance Overall balance assessment: Independent ?  ?  ?  ?  ?  ?  ?  ?  ?  ?  ?  ?  ?  ?  ?  ?  ?  ?  ?   ? ?ADL either performed or assessed with clinical judgement  ? ?ADL Overall ADL's : Needs assistance/impaired ?  ?  ?  ?  ?  ?  ?  ?  ?  ?  ?  ?  ?  ?  ?  ?  ?  ?  ?  ?General ADL Comments: MOD I don/doff B socks seated in chair. Independent mobility including collecting trash from floor. SETUP self-feeding and grooming  - assist to open packages.  ? ? ? ? ?Pertinent Vitals/Pain Pain Assessment ?Pain Assessment: No/denies pain  ? ? ? ?Hand Dominance Right ?  ?Extremity/Trunk Assessment Upper Extremity Assessment ?Upper Extremity Assessment: LUE deficits/detail ?LUE Deficits / Details: impaired FNF and palm to fingertip translation ?LUE Sensation: decreased light touch ?  ?Lower Extremity Assessment ?Lower Extremity Assessment: Overall WFL for tasks assessed ?  ?Cervical / Trunk Assessment ?  Cervical / Trunk Assessment: Normal ?  ?Communication Communication ?Communication: No difficulties ?  ?Cognition Arousal/Alertness: Awake/alert ?Behavior During Therapy: [facility] for tasks assessed/performed ?Overall Cognitive Status: Within Functional Limits for tasks assessed ?  ?  ?  ?  ?  ?  ?  ?  ?  ?  ?  ?  ?  ?  ?  ?  ?  ?  ?  ?General Comments    ? ?  ?Exercises Exercises: Other exercises ?Other Exercises ?Other Exercises: Reviewed HEP with green theraputty and [place] activities using tissues ?  ?Shoulder Instructions    ? ? ?Home  Living Family/patient expects to be discharged to:: Private residence ?Living Arrangements: Spouse/significant other ?Available Help at Discharge: Family ?Type of Home: House ?Home Access: Level entry ?  ?  ?Home Layout: One level ?  ?  ?Bathroom Shower/Tub: Walk-in shower ?  ?Bathroom Toilet: Handicapped height ?  ?  ?Home Equipment: None ?  ?  ?  ? ?  ?Prior Functioning/Environment Prior Level of Function : Independent/Modified Independent;Driving ?  ?  ?  ?  ?  ?  ?Mobility Comments: Indep with ADLs, household and community mobilization without assist device; denies fall history.  Active, enjoys swimming (approx 1 mile), 3x/week. ?ADLs Comments: indep ?  ? ?  ?  ?OT Problem List: Decreased strength;Decreased coordination ?  ?   ?OT Treatment/Interventions: Self-care/ADL training;Therapeutic exercise;Energy conservation;DME and/or AE instruction;Therapeutic activities;Neuromuscular education;Patient/family education;Balance training  ?  ?OT Goals(Current goals can be found in the care plan section) Acute Rehab OT Goals ?Patient Stated Goal: to go home ?OT Goal Formulation: With patient/family ?Time For Goal Achievement: 11/21/21 ?Potential to Achieve Goals: Good ?ADL Goals ?Pt Will Perform Upper Body Dressing: Independently;standing ?Pt/caregiver will Perform Home Exercise Program: Increased strength;Left upper extremity;With theraputty  ?OT Frequency: Min 1X/week ?  ? ?   ?AM-PAC OT "6 Clicks" Daily Activity     ?Outcome Measure Help from another person eating meals?: None ?Help from another person taking care of personal grooming?: A Little ?Help from another person toileting, which includes using toliet, bedpan, or urinal?: None ?Help from another person bathing (including washing, rinsing, drying)?: None ?Help from another person to put on and taking off regular upper body clothing?: A Little ?Help from another person to put on and taking off regular lower body clothing?: None ?6 Click Score: 22 ?  ?End of  Session   ? ?Activity Tolerance: Patient tolerated treatment well ?Patient left: in bed;with call bell/phone within reach;with nursing/sitter in room;with family/visitor present ? ?OT Visit Diagnosis: Other abnormalities of gait and mobility (R26.89);Muscle weakness (generalized) (M62.81)  ?              ?Time: [id number] ?OT Time Calculation (min): 16 min ?Charges:  OT General Charges ?$OT Visit: 1 Visit ?OT Evaluation ?$OT Eval Low Complexity: 1 Low ? ?Dessie Coma, M.S. OTR/L  ?11/07/21, 1:15 PM  ?ascom [phone] ? ?

## 2021-11-07 NOTE — ED Notes (Signed)
Will give long-acting insulin once received from pharm.  ?

## 2021-11-07 NOTE — ED Notes (Signed)
Pt to MRI at this time.

## 2021-11-07 NOTE — ED Notes (Signed)
Pt walking with PT staff.  ?

## 2021-11-07 NOTE — Assessment & Plan Note (Signed)
Hold antihypertensives to allow for permissive hypertension 

## 2021-11-07 NOTE — ED Notes (Signed)
Pt given graham crackers, PB, and diet coke. Waiting on breakfast tray.  ?

## 2021-11-07 NOTE — H&P (Signed)
?History and Physical  ? ? ?PatientDian Beltran [id number] DOB: 1941/02/21 ?DOA: 11/06/2021 ?DOS: the patient was seen and examined on 11/07/2021 ?PCP: Derinda Late, MD  ?Patient coming from: Home ? ?Chief Complaint:  ?Chief Complaint  ?Patient presents with  ? Code Stroke  ? ? ?HPI: Jeffery Beltran is a 81 y.o. male with medical history significant for DM, HTN, paroxysmal atrial fibrillation, started on Eliquis 4 days prior, recent CVA 4/6 affecting left upper extremity who presents to the ED as a code stroke after presenting with numbness to the same left upper extremity with symptoms started at 10:45 PM.  NIH score was 1.  Initial head CT was negative.  He was seen in consultation by teleneurology who recommended MRI/MRA.  Results showed a small focus of subacute ischemia within the right precentral sulcus without hemorrhage or mass effect.  MRA of the head and neck were normal. ?Additional data review: Vitals within normal limits.  Blood work significant for creatinine of 1.67 which appears to be his baseline EKG, personally viewed and interpreted showing sinus at 60 with nonspecific ST-T wave changes. ?Hospitalist consulted for admission.  ? ?Review of Systems: As mentioned in the history of present illness. All other systems reviewed and are negative. ?Past Medical History:  ?Diagnosis Date  ? Diabetes ([place])   ? Glaucoma   ? Hypertension   ? Stroke [facility])   ? ?Past Surgical History:  ?Procedure Laterality Date  ? CATARACT EXTRACTION Right   ? EYE SURGERY Right   ? Cat Sx  ? EYE SURGERY Right   ? Trab  ? IRIDOTOMY / IRIDECTOMY Right   ? Trab  ? TRABECULECTOMY Right   ? YAG LASER APPLICATION Right   ? ?Social History:  reports that he has never smoked. He has never used smokeless tobacco. He reports current alcohol use. He reports that he does not use drugs. ? ?No Known Allergies ? ?Family History  ?Problem Relation Age of Onset  ? Amblyopia Neg Hx   ? Blindness Neg Hx   ? Cataracts Neg Hx   ? Glaucoma  Neg Hx   ? Macular degeneration Neg Hx   ? Retinal detachment Neg Hx   ? Strabismus Neg Hx   ? Retinitis pigmentosa Neg Hx   ? ? ?Prior to Admission medications   ?Medication Sig Start Date End Date Taking? Authorizing Provider  ?apixaban (ELIQUIS) 5 MG TABS tablet Take 1 tablet (5 mg total) by mouth 2 (two) times daily. 11/06/21   Wellington Hampshire, MD  ?AZOPT 1 % ophthalmic suspension Place 1 drop into both eyes 2 (two) times daily. 12/03/16   [provider]  ?brimonidine (ALPHAGAN P) 0.1 % SOLN Place 1 drop into both eyes 2 (two) times daily.    [provider]  ?Continuous Blood Gluc Receiver ([place]) [place]  01/19/21   [provider]  ?Continuous Blood Gluc Sensor (DEXCOM G6 SENSOR) MISC SMARTSIG:1 Each Topical Every 10 Days 01/19/21   [provider]  ?Continuous Blood Gluc Transmit (DEXCOM G6 TRANSMITTER) [place]  01/19/21   [provider]  ?Dapagliflozin Propanediol (FARXIGA PO) Take 10 mg/day by mouth daily.    [provider]  ?ezetimibe (ZETIA) 10 MG tablet Take 1 tablet (10 mg total) by mouth daily. 10/13/21 11/12/21  Wyvonnia Dusky, MD  ?Cleda Clarks 100 UNIT/ML KiwkPen Inject 30 Units into the skin 3 (three) times daily. 11/27/16   [provider]  ?Insulin Glargine 300 UNIT/ML SOPN Inject 16-17  Units into the skin daily after supper.    [provider]  ?latanoprost (XALATAN) 0.005 % ophthalmic solution Place 1 drop into both eyes at bedtime.  12/03/16   [provider]  ?Loratadine 10 MG CAPS Take by mouth.    [provider]  ?losartan (COZAAR) 100 MG tablet Take by mouth.    [provider]  ?metFORMIN (GLUCOPHAGE-XR) 500 MG 24 hr tablet 1,000 mg daily after supper. 11/27/16   [provider]  ?Multiple Vitamins-Minerals (CENTRUM SILVER 50+MEN) TABS Take 1 tablet by mouth daily.    [provider]  ?nebivolol (BYSTOLIC) 5 MG tablet Take 1 tablet (5 mg total) by mouth daily. Hold  this medication until you see your PCP and/or cardiologist. HR has been on lower end 10/12/21   Wyvonnia Dusky, MD  ?Sherron Flemings X 6 MM MISC  12/03/16   [provider]  ?ONE TOUCH ULTRA TEST test strip  12/03/16   [provider]  ?simvastatin (ZOCOR) 20 MG tablet Take 20 mg by mouth at bedtime. 02/14/17   [provider]  ?triamterene-hydrochlorothiazide (MAXZIDE-25) 37.5-25 MG tablet Take 1 tablet by mouth daily. 02/14/20   [provider]  ?TRULICITY [id number] 0000000 SOPN Inject into the skin once a week. 10/19/21   [provider]  ? ? ?Physical Exam: ?Vitals:  ? 11/06/21 2320 11/06/21 2330 11/07/21 0100  ?BP:  129/74 (!) 143/62  ?Pulse:  67 (!) 58  ?Resp:  18 18  ?Temp:  98.1 ?F (36.7 ?C)   ?TempSrc:  Oral   ?SpO2:  98% 98%  ?Weight: 108.8 kg    ?Height: 5\' 10"  (1.778 m)    ? ?Physical Exam ?Vitals and nursing note reviewed.  ?Constitutional:   ?   General: He is not in acute distress. ?HENT:  ?   Head: Normocephalic and atraumatic.  ?Cardiovascular:  ?   Rate and Rhythm: Normal rate and regular rhythm.  ?   Pulses: Normal pulses.  ?   Heart sounds: Normal heart sounds.  ?Pulmonary:  ?   Effort: Pulmonary effort is normal.  ?   Breath sounds: Normal breath sounds.  ?Abdominal:  ?   Palpations: Abdomen is soft.  ?   Tenderness: There is no abdominal tenderness.  ?Neurological:  ?   Comments: Mild dysmetria left upper extremity otherwise no focal deficit  ? ? ? ?Data Reviewed: ?Relevant notes from primary care and specialist visits, past discharge summaries as available in EHR, including Care Everywhere. ?Prior diagnostic testing as pertinent to current admission diagnoses ?Updated medications and problem lists for reconciliation ?ED course, including vitals, labs, imaging, treatment and response to treatment ?Triage notes, nursing and pharmacy notes and ED provider's notes ?Notable results as noted in HPI ? ? ?Assessment and Plan: ?* Recurrent cerebrovascular accidents  (CVAs) ([place]) ?Permissive hypertension for first 24-48 hrs post stroke onset: Prn Labetalol IV or Vasotec IV If BP greater than 220/120  ?Statins for LDL goal less than 70 ?Currently on Eliquis ?Telemetry,  ?Avoid dextrose containing fluids, Maintain euglycemia, euthermia ?Neuro checks q4 hrs x 24 hrs and then per shift ?Head of bed 30 degrees ?Physical therapy/Occupational therapy/Speech therapy if failed dysphagia screen ?Neurology consult to follow ? ? ?Paroxysmal atrial fibrillation on apixaban (H[facility]) ?Continue eliquis. Hold nebivolol for permissive hypertension ? ?Chronic kidney disease, stage 3a ([place]) ?At baseline ? ?Hypertension ?Hold antihypertensives to allow for permissive hypertension ? ?Diabetes ([place]) ?Basal insulin with sliding scale coverage ? ? ? ? ? ? ?  Advance Care Planning:   Code Status: Prior  ? ?Consults: neurology Dr Rory Percy ? ?Family Communication: none ? ?Severity of Illness: ?The appropriate patient status for this patient is OBSERVATION. Observation status is judged to be reasonable and necessary in order to provide the required intensity of service to ensure the patient's safety. The patient's presenting symptoms, physical exam findings, and initial radiographic and laboratory data in the context of their medical condition is felt to place them at decreased risk for further clinical deterioration. Furthermore, it is anticipated that the patient will be medically stable for discharge from the hospital within 2 midnights of admission.  ? ?Author: ?Athena Masse, MD ?11/07/2021 2:22 AM ? ?For on call review [email or web address].  ?

## 2021-11-07 NOTE — Telephone Encounter (Signed)
Pt was started on Eliquis 5mg  BID on 11/02/2021. Clopidogrel was d/c on 11/02/2021. Pt saw Dr. 11/04/2021 11/06/2021 and per note:  ? ? ? Paroxysmal atrial fibrillation: I suspect this is the likely etiology for the patient's recent ischemic stroke.  Fortunately, he does not have symptoms related to atrial fibrillation.  His CHA2DS2-VASc score is 6 and thus he is at very high risk for thromboembolic complications related to atrial fibrillation.  He was started on Eliquis 5 mg twice daily.  Dual antiplatelet therapy with aspirin and clopidogrel were discontinued.  Given lack of symptoms, no need for an antiarrhythmic medication. ? ?Pt currently in the ER with a possible code stroke.  ? ? ?

## 2021-11-07 NOTE — Discharge Summary (Signed)
? ? ?Physician Discharge Summary  ?Patient: Jeffery Beltran [id number] DOB: [birth year]   Code Status: Full Code ?Admit date: 11/06/2021 ?Discharge date: 11/07/2021 ?Disposition: Home, No home health services recommended ?PCP: Derinda Late, MD ? ?Recommendations for Outpatient Follow-up:  ?Follow up with PCP within 1-2 weeks ?Metabolic panel and kidney function monitoring ?Follow-up with neurology in 4 to 6 weeks ?Follow-up with cardiology  ?echo ? ?Discharge Diagnoses:  ?Principal Problem: ?  Recurrent cerebrovascular accidents (CVAs) ([place]) ?Active Problems: ?  Paroxysmal atrial fibrillation on apixaban ([place]) ?  Diabetes ([place]) ?  Hypertension ?  Chronic kidney disease, stage 3a ([place]) ?  Acute CVA (cerebrovascular accident) ([place]) ? ?Brief Hospital Course Summary: ?Jeffery Beltran is a 81 y.o. male with a PMH significant for DM, HTN, paroxysmal atrial fibrillation, started on Eliquis 4 days prior, recent CVA 4/6 affecting left upper extremity. ? ?They presented from home to the ED on 11/06/2021 with numbness and weakness to left upper extremity and presented within hours of symptom onset.  This is similar to his previous stroke presentation. ? ?In the ED, it was found that they had stable vital signs with mild bradycardia as low as 58 and hypertension as high as 150s over 70s.  ?Significant findings included CBG [id number], stable metabolic panel with exception of increased creatinine of 1.6 from baseline around 1.3-1.4.  Lipid panel showed LDL 35, HDL 38. ?Brain MRI positive for small focus of subacute ischemia within the right precentral sulcus without hemorrhage or mass effect.  MRAs of head and neck were normal. ?Neurology was consulted for further evaluation.  Only other additional work-up recommended was an echocardiogram. ? ?They were initially treated with symptomatic care as well as home medications including Eliquis.  PT/OT evaluated and provided therapy.  Outpatient OT was the only follow-up  recommendation. ? ?Patient's symptoms were mild and included weakness in his left fourth and fifth digit but he otherwise felt well and had stable vital signs.  He was discharged for outpatient follow-up including an echocardiogram and outpatient OT.  Patient will be continued on his home preventative medications of statin, aspirin, Plavix, Eliquis. ? ?Discharge Condition: Good, improved ?Recommended discharge diet: Regular healthy diet ? ?Consultations: ?Neurology ? ?Procedures/Studies: ?Brain MRI ? ? ?Allergies as of 11/07/2021   ?No Known Allergies ?  ? ?  ?Medication List  ?  ? ?TAKE these medications   ? ?apixaban 5 MG Tabs tablet ?Commonly known as: ELIQUIS ?Take 1 tablet (5 mg total) by mouth 2 (two) times daily. ?  ?Azopt 1 % ophthalmic suspension ?Generic drug: brinzolamide ?Place 1 drop into both eyes 2 (two) times daily. ?  ?brimonidine 0.1 % Soln ?Commonly known as: ALPHAGAN P ?Place 1 drop into both eyes 2 (two) times daily. ?  ?Centrum Silver 50+Men Tabs ?Take 1 tablet by mouth daily. ?  ?Dexcom G6 Receiver Kerrin Mo ?  ?Dexcom G6 Sensor Misc ?SMARTSIG:1 Each Topical Every 10 Days ?  ?Dexcom G6 Transmitter Misc ?  ?ezetimibe 10 MG tablet ?Commonly known as: ZETIA ?Take 1 tablet (10 mg total) by mouth daily. ?  ?FARXIGA PO ?Take 10 mg/day by mouth daily. ?  ?HumaLOG KwikPen 100 UNIT/ML KwikPen ?Generic drug: insulin lispro ?Inject 22-24 Units into the skin 3 (three) times daily. ?  ?Insulin Glargine 300 UNIT/ML Sopn ?Inject 16-17 Units into the skin daily after supper. ?  ?latanoprost 0.005 % ophthalmic solution ?Commonly known as: XALATAN ?Place 1 drop into both eyes at bedtime. ?  ?Loratadine 10 MG Caps ?Take 10 mg  by mouth daily at 12 noon. ?  ?losartan 100 MG tablet ?Commonly known as: COZAAR ?Take 100 mg by mouth daily. ?  ?metFORMIN 500 MG 24 hr tablet ?Commonly known as: GLUCOPHAGE-XR ?1,000 mg daily after supper. ?  ?nebivolol 5 MG tablet ?Commonly known as: BYSTOLIC ?Take 0.5 tablets (2.5 mg total) by  mouth daily. Hold this medication until you see your PCP and/or cardiologist. HR has been on lower end ?  ?NovoFine 32G X 6 MM Misc ?Generic drug: Insulin Pen Needle ?  ?ONE TOUCH ULTRA TEST test strip ?Generic drug: glucose blood ?  ?simvastatin 20 MG tablet ?Commonly known as: ZOCOR ?Take 20 mg by mouth at bedtime. ?  ?triamterene-hydrochlorothiazide 37.5-25 MG tablet ?Commonly known as: MAXZIDE-25 ?Take 1 tablet by mouth daily. ?  ?Trulicity A999333 0000000 Sopn ?Generic drug: Dulaglutide ?Inject 0.75 mg into the skin every Sunday. ?  ? ?  ? ? ? ?Subjective   ?Pt reports feeling well overall.  Describes mild weakness in his left fourth and fifth digit denies dizziness, shortness of breath, chest pain. ?Objective  ?Blood pressure 137/66, pulse 62, temperature 98.1 ?F (36.7 ?C), temperature source Oral, resp. rate 18, height 5\' 10"  (1.778 m), weight 108.8 kg, SpO2 98 %.  ? ?General: Pt is alert, awake, not in acute distress ?Cardiovascular: RRR, S1/S2 +, no rubs, no gallops ?Respiratory: CTA bilaterally, no wheezing, no rhonchi ?Abdominal: Soft, NT, ND, bowel sounds + ?Extremities: no edema, no cyanosis ?Neuro: A&O x3. Moving all extremities equally.  Mild sensory abnormality in the left fourth and fifth digit. ? ? ?The results of significant diagnostics from this hospitalization (including imaging, microbiology, ancillary and laboratory) are listed below for reference.  ? ?Imaging studies: ?CT ANGIO HEAD NECK W WO CM ? ?Result Date: 10/11/2021 ?CLINICAL DATA:  Stroke workup.  Left arm weakness EXAM: CT ANGIOGRAPHY HEAD AND NECK TECHNIQUE: Multidetector CT imaging of the head and neck was performed using the standard protocol during bolus administration of intravenous contrast. Multiplanar CT image reconstructions and MIPs were obtained to evaluate the vascular anatomy. Carotid stenosis measurements (when applicable) are obtained utilizing NASCET criteria, using the distal internal carotid diameter as the denominator.  RADIATION DOSE REDUCTION: This exam was performed according to the departmental dose-optimization program which includes automated exposure control, adjustment of the mA and/or kV according to patient size and/or use of iterative reconstruction technique. CONTRAST:  30mL OMNIPAQUE IOHEXOL 350 MG/ML SOLN COMPARISON:  CT and MRI of the brain from earlier today FINDINGS: CTA NECK FINDINGS Aortic arch: Unremarkable Right carotid system: Minimal atheromatous plaque at the bifurcation. No stenosis or ulceration. Left carotid system: No detected atheromatous changes. No stenosis or ulceration. Vertebral arteries: No proximal subclavian stenosis. The vertebral arteries are tortuous within the transverse foramina but smoothly contoured and diffusely patent. Skeleton: Generalized degenerative disc collapse and ridging. Other neck: Negative Upper chest: Negative Review of the MIP images confirms the above findings CTA HEAD FINDINGS Anterior circulation: No major branch occlusion, beading, aneurysm, or flow limiting stenosis. Atheromatous irregularity of medium size vessels Posterior circulation: Vertebral and basilar arteries are smoothly contoured and diffusely patent. Moderate left P2/P3 segment stenosis. Negative for aneurysm or generalized beading. Venous sinuses: Diffusely patent Anatomic variants: None significant Review of the MIP images confirms the above findings Subtle infarct on preceding brain MRI in the right cerebral hemisphere, addendum in progress and findings communicated to clinical service via epic chat. IMPRESSION: No emergent finding. Atherosclerosis primarily affects intracranial branches, including moderate narrowing at the left  proximal PCA. Electronically Signed   By: Jorje Guild M.D.   On: 10/11/2021 04:49  ? ?MR ANGIO HEAD WO CONTRAST ? ?Result Date: 11/07/2021 ?CLINICAL DATA:  Left-sided weakness EXAM: MRI HEAD WITHOUT CONTRAST MRA HEAD WITHOUT CONTRAST MRA NECK WITHOUT AND WITH CONTRAST  TECHNIQUE: Multiplanar, multi-echo pulse sequences of the brain and surrounding structures were acquired without intravenous contrast. Angiographic images of the Circle of Willis were acquired using MRA technique without intrav

## 2021-11-07 NOTE — Evaluation (Signed)
Physical Therapy Evaluation ?Patient Details ?Name: Rogerio Boutelle ?MRN: [id number] ?DOB: [birth year] ?Today's Date: 11/07/2021 ? ?History of Present Illness ? Rolly Magri is a 81 y.o. male with medical history significant for DM, HTN, paroxysmal atrial fibrillation, started on Eliquis 4 days prior, recent CVA 4/6 affecting left upper extremity who presents to the ED as a code stroke after presenting with numbness to the same left upper extremity with symptoms started at 10:45 PM.  NIH score was 1.  Initial head CT was negative.  He was seen in consultation by teleneurology who recommended MRI/MRA.  Results showed a small focus of subacute ischemia within the right precentral sulcus without hemorrhage or mass effect.  MRA of the head and neck were normal. ?  ?Clinical Impression ? Patient received sitting up on side of stretcher, wife present in room. He reports he feels fine other than his left hand/arm feeling asleep and coordination is off. Patient transferred independently and ambulated independently in hallway. No difficulties observed. He does not need continued PT at this time. Signing off.      ?   ? ?Recommendations for follow up therapy are one component of a multi-disciplinary discharge planning process, led by the attending physician.  Recommendations may be updated based on patient status, additional functional criteria and insurance authorization. ? ?Follow Up Recommendations No PT follow up ? ?  ?Assistance Recommended at Discharge None  ?Patient can return home with the following ?   ? ?  ?Equipment Recommendations None recommended by PT  ?Recommendations for Other Services ?    ?  ?Functional Status Assessment Patient has not had a recent decline in their functional status  ? ?  ?Precautions / Restrictions Precautions ?Precautions: None ?Restrictions ?Weight Bearing Restrictions: No  ? ?  ? ?Mobility ? Bed Mobility ?Overal bed mobility: Independent ?  ?  ?  ?  ?  ?  ?  ?  ? ?Transfers ?Overall  transfer level: Independent ?Equipment used: None ?  ?  ?  ?  ?  ?  ?  ?  ?  ? ?Ambulation/Gait ?Ambulation/Gait assistance: Independent ?Gait Distance (Feet): 250 Feet ?Assistive device: None ?Gait Pattern/deviations: WFL(Within Functional Limits) ?Gait velocity: WFL ?  ?  ?General Gait Details: no difficulty, no lob ? ?Stairs ?  ?  ?  ?  ?  ? ?Wheelchair Mobility ?  ? ?Modified Rankin (Stroke Patients Only) ?  ? ?  ? ?Balance Overall balance assessment: Independent ?  ?  ?  ?  ?  ?  ?  ?  ?  ?  ?  ?  ?  ?  ?  ?  ?  ?  ?   ? ? ? ?Pertinent Vitals/Pain Pain Assessment ?Pain Assessment: No/denies pain  ? ? ?Home Living Family/patient expects to be discharged to:: Private residence ?Living Arrangements: Spouse/significant other ?Available Help at Discharge: Family ?Type of Home: House ?Home Access: Level entry ?  ?  ?  ?Home Layout: One level ?Home Equipment: None ?   ?  ?Prior Function Prior Level of Function : Independent/Modified Independent;Driving ?  ?  ?  ?  ?  ?  ?Mobility Comments: Indep with ADLs, household and community mobilization without assist device; denies fall history.  Active, enjoys swimming (approx 1 mile), 3x/week. ?ADLs Comments: indep ?  ? ? ?Hand Dominance  ? Dominant Hand: Right ? ?  ?Extremity/Trunk Assessment  ? Upper Extremity Assessment ?Upper Extremity Assessment: Defer to OT evaluation ?  ? ?Lower  Extremity Assessment ?Lower Extremity Assessment: Overall WFL for tasks assessed ?  ? ?Cervical / Trunk Assessment ?Cervical / Trunk Assessment: Normal  ?Communication  ? Communication: No difficulties  ?Cognition Arousal/Alertness: Awake/alert ?Behavior During Therapy: [facility] for tasks assessed/performed ?Overall Cognitive Status: Within Functional Limits for tasks assessed ?  ?  ?  ?  ?  ?  ?  ?  ?  ?  ?  ?  ?  ?  ?  ?  ?  ?  ?  ? ?  ?General Comments   ? ?  ?Exercises    ? ?Assessment/Plan  ?  ?PT Assessment Patient does not need any further PT services  ?PT Problem List   ? ?   ?  ?PT  Treatment Interventions     ? ?PT Goals (Current goals can be found in the Care Plan section)  ?Acute Rehab PT Goals ?Patient Stated Goal: return home ?PT Goal Formulation: With patient/family ?Time For Goal Achievement: 11/14/21 ?Potential to Achieve Goals: Good ? ?  ?Frequency   ?  ? ? ?Co-evaluation   ?  ?  ?  ?  ? ? ?  ?AM-PAC PT "6 Clicks" Mobility  ?Outcome Measure Help needed turning from your back to your side while in a flat bed without using bedrails?: None ?Help needed moving from lying on your back to sitting on the side of a flat bed without using bedrails?: None ?Help needed moving to and from a bed to a chair (including a wheelchair)?: None ?Help needed standing up from a chair using your arms (e.g., wheelchair or bedside chair)?: None ?Help needed to walk in hospital room?: None ?Help needed climbing 3-5 steps with a railing? : None ?6 Click Score: 24 ? ?  ?End of Session   ?Activity Tolerance: Patient tolerated treatment well ?Patient left: in bed ?Nurse Communication: Mobility status ?  ?  ? ?Time: 2563-8937 ?PT Time Calculation (min) (ACUTE ONLY): 13 min ? ? ?Charges:   PT Evaluation ?$PT Eval Low Complexity: 1 Low ?  ?  ?   ? ? ?[employer], PT, G[employer] ?11/07/21,9:45 AM ? ?

## 2021-11-07 NOTE — ED Notes (Signed)
Pt passed swallow screen. Waiting for diet order. Pt unhooked to go to restroom independently. Denies further needs.  ?

## 2021-11-07 NOTE — Discharge Instructions (Signed)
Please continue your home medications.  ?Follow up with neurology in 2-4 weeks ?I recommend calling your primary or cardiologist to follow up with an echocardiogram order. ?Call 911 if you experience new or worsening symptoms for stroke ?

## 2021-11-07 NOTE — Progress Notes (Signed)
SLP Cancellation Note ? ?Patient Details ?Name: Jeffery Beltran ?MRN: [id number] ?DOB: [birth year] ? ? ?Cancelled treatment:       Reason Eval/Treat Not Completed: SLP screened, no needs identified, will sign off (chart reviewed; consulted NSG then met w/ pt/Wife in room.) ?Pt denied any difficulty swallowing and is currently on a regular diet; tolerates swallowing pills w/ water per NSG. Ate 100% of breakfast meal. Pt conversed in conversation w/out expressive/receptive deficits noted; pt denied any speech-language deficits. Speech clear. ?No further skilled ST services indicated as pt appears at his baseline. Pt agreed. NSG to reconsult if any change in status while admitted.   ? ? ? ? ? ?Orinda Kenner, MS, CCC-SLP ?Speech Language Pathologist ?Rehab Services; [place] ?[phone] (ascom) ?[name] ?11/07/2021, 9:51 AM ?

## 2021-11-07 NOTE — Assessment & Plan Note (Signed)
Continue eliquis. Hold nebivolol for permissive hypertension ?

## 2021-11-07 NOTE — Assessment & Plan Note (Addendum)
Permissive hypertension for first 24-48 hrs post stroke onset: Prn Labetalol IV or Vasotec IV If BP greater than 220/120  ?Statins for LDL goal less than 70 ?Currently on Eliquis ?Telemetry,  ?Avoid dextrose containing fluids, Maintain euglycemia, euthermia ?Neuro checks q4 hrs x 24 hrs and then per shift ?Head of bed 30 degrees ?Physical therapy/Occupational therapy/Speech therapy if failed dysphagia screen ?Neurology consult to follow ? ?

## 2021-11-07 NOTE — ED Provider Notes (Signed)
? ?[facility] ?Provider Note ? ? ? Event Date/Time  ? First MD Initiated Contact with Patient 11/06/21 2335   ?  (approximate) ? ? ?History  ? ?Code Stroke ? ? ?HPI ?Level 5 caveat:  history/ROS limited by acute/critical illness ? ?Jeffery Beltran is a 81 y.o. 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  He presents tonight by private vehicle for evaluation of acute recurrence of left upper extremity symptoms.  However he tells me this time it feels more like the arm is asleep rather than being weak or "curled up" like it was before.  This occurred at approximately 10:45 PM while he was getting ready to go to sleep and pulling the cover up in bed.  He has no pain.  He has no other symptoms including no visual disturbances, difficulty speaking or with word finding, weakness in his legs or his other arm, etc.  He is right arm dominant.  He denies chest pain, shortness of breath, nausea, vomiting, and abdominal pain.  He has been compliant with his Eliquis and other medications. ? ?He was identified as a possible acute CVA in triage and code stroke was initiated by the triage nurse.  I saw him in the hallway and consented to him going to CT head as per protocol.  Teleneurology was involved immediately with his care before I even saw him in the exam room. ?  ? ? ?Physical Exam  ? ?Triage Vital Signs: ?ED Triage Vitals  ?Enc Vitals Group  ?   BP 11/06/21 2330 129/74  ?   Pulse Rate 11/06/21 2330 67  ?   Resp 11/06/21 2330 18  ?   Temp 11/06/21 2330 98.1 ?F (36.7 ?C)  ?   Temp Source 11/06/21 2330 Oral  ?   SpO2 11/06/21 2330 98 %  ?   Weight 11/06/21 2320 108.8 kg (239 lb 13.8 oz)  ?   Height 11/06/21 2320 1.778 m (5\' 10" )  ?   Head Circumference --   ?   Peak Flow --   ?   Pain Score 11/07/21 0020 0  ?   Pain Loc --   ?   Pain Edu? --   ?   Excl. in  [place]? --   ? ? ?Most recent vital signs: ?Vitals:  ? 11/06/21 2330 11/07/21 0100  ?BP: 129/74 (!) 143/62  ?Pulse: 67 (!) 58  ?Resp: 18 18  ?Temp: 98.1 ?F (36.7 ?C)   ?SpO2: 98% 98%  ? ? ? ?General: Awake, no distress.  Conversant, generally in good spirits. ?CV:  Good peripheral perfusion.  Normal heart sounds. ?Resp:  Normal effort.  Lungs are clear to auscultation bilaterally. ?Abd:  No distention.  No tenderness to palpation. ?Other:  Reassuring neurological exam.  Pupils are equal and reactive.  No nystagmus.  He has good major muscle group strength in upper and lower extremities.  He has some mild limb ataxia in the left upper extremity, most demonstrable when doing finger-to-nose testing; he is able to do it, but requires effort.  He has no appreciable weakness. ? ?NIH Stroke Scale ? ?Interval: Baseline ?Time: 00:45 AM ?Person Administering Scale: Hinda Kehr ? ?Administer stroke scale items in the order listed. Record performance in each category after each subscale exam. Do not go back and change scores. Follow  directions provided for each exam technique. Scores should reflect what the patient does, not what the clinician thinks the patient can do. The clinician should record answers while administering the exam and work quickly. Except where indicated, the patient should not be coached (i.e., repeated requests to patient to make a special effort). ? ? ?1a  Level of consciousness: 0=alert; keenly responsive  ?1b. LOC questions:  0=Performs both tasks correctly  ?1c. LOC commands: 0=Performs both tasks correctly  ?2.  Best Gaze: 0=normal  ?3.  Visual: 0=No visual loss  ?4. Facial Palsy: 0=Normal symmetric movement  ?5a.  Motor left arm: 0=No drift, limb holds 90 (or 45) degrees for full 10 seconds  ?5b.  Motor right arm: 0=No drift, limb holds 90 (or 45) degrees for full 10 seconds  ?6a. motor left leg: 0=No drift, limb holds 90 (or 45) degrees for full 10 seconds  ?6b  Motor right leg:  0=No drift, limb holds  90 (or 45) degrees for full 10 seconds  ?7. Limb Ataxia: 1=Present in one limb  ?8.  Sensory: 0=Normal; no sensory loss  ?9. Best Language:  0=No aphasia, normal  ?10. Dysarthria: 0=Normal  ?11. Extinction and Inattention: 0=No abnormality  ?12. Distal motor function: 0=Normal  ? Total:   1  ? ? ? ? ?ED Results / Procedures / Treatments  ? ?Labs ?(all labs ordered are listed, but only abnormal results are displayed) ?Labs Reviewed  ?COMPREHENSIVE METABOLIC PANEL - Abnormal; Notable for the following components:  ?    Result Value  ? Glucose, Bld 159 (*)   ? BUN 37 (*)   ? Creatinine, Ser 1.67 (*)   ? GFR, Estimated 41 (*)   ? All other components within normal limits  ?LIPID PANEL - Abnormal; Notable for the following components:  ? HDL 38 (*)   ? All other components within normal limits  ?CBG MONITORING, ED - Abnormal; Notable for the following components:  ? Glucose-Capillary 151 (*)   ? All other components within normal limits  ?CBG MONITORING, ED - Abnormal; Notable for the following components:  ? Glucose-Capillary 125 (*)   ? All other components within normal limits  ?PROTIME-INR  ?APTT  ?CBC  ?DIFFERENTIAL  ? ? ? ?EKG ? ?ED ECG REPORT ?IHinda Kehr, the attending physician, personally viewed and interpreted this ECG. ? ?Date: 11/06/2021 ?EKG Time: 23: 24 ?Rate: 60 ?Rhythm: normal sinus rhythm ?QRS Axis: Borderline left axis deviation ?Intervals: normal ?ST/T Wave abnormalities: Non-specific ST segment / T-wave changes, but no clear evidence of acute ischemia. ?Narrative Interpretation: no definitive evidence of acute ischemia; does not meet STEMI criteria. ? ? ? ?RADIOLOGY ?I personally reviewed the patient's noncontrast head CT and see no evidence of acute bleed.  I also personally reviewed his MRIs and I see no acute abnormalities.  Radiology believes that there is a small focus of subacute infarction on MRI brain but the MRAs were normal. ? ? ? ?PROCEDURES: ? ?Critical Care performed: Yes, see  critical care procedure note(s) ? ?.1-3 Lead EKG Interpretation ?Performed by: Hinda Kehr, MD ?Authorized by: Hinda Kehr, MD  ? ?  Interpretation: normal   ?  ECG rate:  65 ?  ECG rate assessment: normal   ?  Rhythm: sinus rhythm   ?  Ectopy: none   ?  Conduction: normal   ?.Critical Care ?Performed by: Hinda Kehr, MD ?Authorized by: Hinda Kehr, MD  ? ?Critical care provider statement:  ?  Critical care time (  minutes):  40 ?  Critical care time was exclusive of:  Separately billable procedures and treating other patients ?  Critical care was necessary to treat or prevent imminent or life-threatening deterioration of the following conditions:  CNS failure or compromise ?  Critical care was time spent personally by me on the following activities:  Development of treatment plan with patient or surrogate, evaluation of patient's response to treatment, examination of patient, obtaining history from patient or surrogate, ordering and performing treatments and interventions, ordering and review of laboratory studies, ordering and review of radiographic studies, pulse oximetry, re-evaluation of patient's condition and review of old charts ? ? ?MEDICATIONS ORDERED IN ED: ?Medications  ?insulin glargine-yfgn (SEMGLEE) injection 16 Units (has no administration in time range)  ?apixaban (ELIQUIS) tablet 5 mg (has no administration in time range)  ? stroke: early stages of recovery book ( Does not apply Not Given 11/07/21 0229)  ?0.9 %  sodium chloride infusion ( Intravenous New Bag/Given 11/07/21 0451)  ?acetaminophen (TYLENOL) tablet 650 mg (has no administration in time range)  ?  Or  ?acetaminophen (TYLENOL) 160 MG/5ML solution 650 mg (has no administration in time range)  ?  Or  ?acetaminophen (TYLENOL) suppository 650 mg (has no administration in time range)  ?insulin aspart (novoLOG) injection 0-15 Units (0 Units Subcutaneous Not Given 11/07/21 0410)  ?sodium chloride flush (NS) 0.9 % injection 3 mL (3 mLs  Intravenous Given 11/06/21 2330)  ?sodium chloride 0.9 % bolus 500 mL (0 mLs Intravenous Stopped 11/07/21 0229)  ?gadobutrol (GADAVIST) 1 MMOL/ML injection 10 mL (10 mLs Intravenous Contrast Given 11/07/21 0043)  ? ? ? ?I

## 2021-11-27 ENCOUNTER — Ambulatory Visit: Payer: Medicare Other | Admitting: Cardiovascular Disease

## 2021-12-17 ENCOUNTER — Encounter: Payer: Self-pay | Admitting: Neurology

## 2021-12-17 ENCOUNTER — Telehealth: Payer: Self-pay | Admitting: Neurology

## 2021-12-17 ENCOUNTER — Ambulatory Visit (INDEPENDENT_AMBULATORY_CARE_PROVIDER_SITE_OTHER): Payer: Medicare Other | Admitting: Neurology

## 2021-12-17 VITALS — BP 143/76 | HR 71 | Ht 70.0 in | Wt 237.8 lb

## 2021-12-17 DIAGNOSIS — I480 Paroxysmal atrial fibrillation: Secondary | ICD-10-CM | POA: Diagnosis not present

## 2021-12-17 DIAGNOSIS — H269 Unspecified cataract: Secondary | ICD-10-CM | POA: Insufficient documentation

## 2021-12-17 DIAGNOSIS — Z794 Long term (current) use of insulin: Secondary | ICD-10-CM | POA: Insufficient documentation

## 2021-12-17 DIAGNOSIS — G249 Dystonia, unspecified: Secondary | ICD-10-CM | POA: Diagnosis not present

## 2021-12-17 DIAGNOSIS — I639 Cerebral infarction, unspecified: Secondary | ICD-10-CM | POA: Diagnosis not present

## 2021-12-17 DIAGNOSIS — E669 Obesity, unspecified: Secondary | ICD-10-CM | POA: Insufficient documentation

## 2021-12-17 DIAGNOSIS — Z9189 Other specified personal risk factors, not elsewhere classified: Secondary | ICD-10-CM

## 2021-12-17 HISTORY — DX: Unspecified cataract: H26.9

## 2021-12-17 NOTE — Patient Instructions (Signed)
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 I also advised the patient to eat a healthy diet with plenty of whole grains, cereals, fruits and vegetables, exercise regularly and maintain ideal body weight .check polysomnogram for sleep apnea as he appears at risk and EEG for seizure activity.  Followup in the future with me in 3 months or call earlier if necessary. Stroke Prevention Some medical conditions and behaviors can lead to a higher chance of having a stroke. You can help prevent a stroke by eating healthy, exercising, not smoking, and managing any medical conditions you have. Stroke is a leading cause of functional impairment. Primary prevention is particularly important because a majority of strokes are first-time events. Stroke changes the lives of not only those who experience a stroke but also their family and other caregivers. How can this condition affect me? A stroke is a medical emergency and should be treated right away. A stroke can lead to brain damage and can sometimes be life-threatening. If a person gets medical treatment right away, there is a better chance of surviving and recovering from a stroke. What can increase my risk? The following medical conditions may increase your risk of a stroke: Cardiovascular disease. High blood pressure (hypertension). Diabetes. High cholesterol. Sickle cell disease. Blood clotting disorders (hypercoagulable state). Obesity. Sleep disorders (obstructive sleep apnea). Other risk factors include: Being  older than age 27. Having a history of blood clots, stroke, or mini-stroke (transient ischemic attack, TIA). Genetic factors, such as race, ethnicity, or a family history of stroke. Smoking cigarettes or using other tobacco products. Taking birth control pills, especially if you also use tobacco. Heavy use of alcohol or drugs, especially cocaine and methamphetamine. Physical inactivity. What actions can I take to prevent this? Manage your health conditions High cholesterol levels. Eating a healthy diet is important for preventing high cholesterol. If cholesterol cannot be managed through diet alone, you may need to take medicines. Take any prescribed medicines to control your cholesterol as told by your health care provider. Hypertension. To reduce your risk of stroke, try to keep your blood pressure below 130/80. Eating a healthy diet and exercising regularly are important for controlling blood pressure. If these steps are not enough to manage your blood pressure, you may need to take medicines. Take any prescribed medicines to control hypertension as told by your health care provider. Ask your health care provider if you should monitor your blood pressure at home. Have your blood pressure checked every year, even if your blood pressure is normal. Blood pressure increases with age and some medical conditions. Diabetes. Eating a healthy diet and exercising regularly are important parts of managing your blood sugar (glucose). If your blood sugar cannot be managed through diet and exercise, you may need to take medicines. Take any prescribed medicines to control your diabetes as told by your health care provider. Get evaluated for obstructive sleep apnea. Talk to your health care provider about getting a sleep evaluation if you snore a lot or have excessive sleepiness. Make sure that any other medical conditions you have, such as atrial fibrillation or atherosclerosis, are  managed. Nutrition  Follow instructions from your health care provider about what to eat or drink to help manage your health condition. These instructions may include: Reducing your daily calorie intake. Limiting how much salt (sodium) you use to 1,500 milligrams (mg) each day. Using only healthy fats for cooking, such as olive oil, canola oil, or sunflower oil. Eating healthy foods. You can do this by: Choosing foods that are high in fiber, such as whole grains, and fresh fruits and vegetables. Eating at least 5 servings of fruits and vegetables a day. Try to fill one-half of your plate with fruits and vegetables at each meal. Choosing lean protein foods, such as lean cuts of meat, poultry without skin, fish, tofu, beans, and nuts. Eating low-fat dairy products. Avoiding foods that are high in sodium. This can help lower blood pressure. Avoiding foods that have saturated fat, trans fat, and cholesterol. This can help prevent high cholesterol. Avoiding processed and prepared foods. Counting your daily carbohydrate intake.  Lifestyle If you drink alcohol: Limit how much you have to: 0-1 drink a day for women who are not pregnant. 0-2 drinks a day for men. Know how much alcohol is in your drink. In the [place] one drink equals one 12 oz bottle of beer (345m), one 5 oz glass of wine (1453m, or one 1 oz glass of hard liquor (4446m Do not use any products that contain nicotine or tobacco. These products include cigarettes, chewing tobacco, and vaping devices, such as e-cigarettes. If you need help quitting, ask your health care provider. Avoid secondhand smoke. Do not use drugs. Activity  Try to stay at a healthy weight. Get at least 30 minutes of exercise on most days, such as: Fast walking. Biking. Swimming. Medicines Take over-the-counter and prescription medicines only as told by your health care provider. Aspirin or blood thinners (antiplatelets or anticoagulants) may be recommended  to reduce your risk of forming blood clots that can lead to stroke. Avoid taking birth control pills. Talk to your health care provider about the risks of taking birth control pills if: You are over 35 2ars old. You smoke. You get very bad headaches. You have had a blood clot. Where to find more information American Stroke Association: www.strokeassociation.org Get help right away if: You or a loved one has any symptoms of a stroke. "BE FAST" is an easy way to remember the main warning signs of a stroke: B - Balance. Signs are dizziness, sudden trouble walking, or loss of balance. E - Eyes. Signs are trouble seeing or a sudden change in vision. F - Face. Signs are sudden weakness or numbness of the face, or the face or eyelid drooping on one side. A - Arms. Signs are weakness or numbness in an arm. This happens suddenly and usually on one side of the body. S - Speech. Signs are sudden trouble speaking, slurred speech, or trouble understanding what people say. T - Time. Time to call emergency services. Write down what time symptoms started. You or a loved one has other signs of a stroke, such as: A sudden, severe headache with no known cause. Nausea or vomiting. Seizure. These symptoms may represent a serious problem that is an emergency. Do not wait to see if the symptoms will go away. Get medical help right away. Call your local emergency services (911 in the [place] Do not drive yourself to the hospital. Summary You can help to prevent a stroke by eating healthy, exercising, not smoking, limiting alcohol intake, and managing any medical  conditions you may have. Do not use any products that contain nicotine or tobacco. These include cigarettes, chewing tobacco, and vaping devices, such as e-cigarettes. If you need help quitting, ask your health care provider. Remember "BE FAST" for warning signs of a stroke. Get help right away if you or a loved one has any of these signs. This information  is not intended to replace advice given to you by your health care provider. Make sure you discuss any questions you have with your health care provider. Document Revised: 01/24/2020 Document Reviewed: 01/24/2020 Elsevier Patient Education  [place].

## 2021-12-17 NOTE — Progress Notes (Signed)
[facility] [address] [place]. [place] [id number] [phone]       OFFICE CONSULT NOTE  [name] Date of Birth:  September 07, 1940 Medical Record Number:  [id number]   Referring MD:  Erick BlinksSalman Khaliqdina  Reason for Referral:  Stroke  HPI: [name] is a 81 year old Caucasian male seen today for initial office consultation visit for stroke.  He is accompanied by his wife.  History is obtained from them and review of electronic medical records and opossum reviewed pertinent available imaging films in PACS.  He has past medical history of diabetes and hypertension.  Patient presented on 10/11/2021 with symptoms of sudden onset of left upper extremity dysfunction.  He stated that he had gotten into bed and was reaching for the remote for the television with the left hand and he felt his left hand was twisted and he could not hold objects.  He found it hard to control his left hand.  He states the hand did not drop down.  He called his wife and got dressed and came to the emergency room symptoms gradually improved after 2 to 3 hours.  MRI scan of the brain was initially read as negative but upon further review showed a subtle area of diffusion white matter hyperintensity in the right perirolandic cortex with corresponding slight dark signal on the ADC map.  Patient was not considered for thrombolysis due to minimum resolving deficits.  CT angiogram of the brain and neck showed no large vessel stenosis or occlusion.  2D echo showed ejection fraction of 60 to 65% without cardiac source of embolism.  Left atrial size was moderately dilated.  Patient underwent outpatient cardiac telemetry monitoring and on 11/23/2021 was found to have atrial fibrillation/flutter which lasted 19 hours and 45 minutes.  He was started on Eliquis.  He presented to the emergency room again on 11/07/2021 with recurrent similar symptoms of left hand incoordination and weakness.  He is not a candidate for  thrombolysis as he was on anticoagulation.  His NIH stroke scale was only 1 for left upper extremity ataxia.  Repeat MRI scan showed subtle right precentral gyrus diffusion hyperintensity consistent with subacute infarct.  Patient had just been started on Eliquis 4 days ago and hence treatment was not changed.  His LDL cholesterol was 35 mg percent.  He was advised to continue his statin and Eliquis.  Patient states he is done well since then has had no recurrent stroke or TIA symptoms.  He is tolerating Eliquis well with only minor bruising and no bleeding.  His sugars have been good and his primary care physician has recently started Trulicity.  Blood pressures under good control today it is borderline in office at 143/76.  He has no new complaints today.  ROS:   14 system review of systems is positive for hand dystonia, posturing, bruising all other systems negative  PMH:  Past Medical History:  Diagnosis Date   Cataract 12/17/2021   Diabetes (HCC)    Glaucoma    Hypertension    Stroke [facility](HCC)     Social History:  Social History   Socioeconomic History   Marital status: Married    Spouse name: Not on file   Number of children: Not on file   Years of education: Not on file   Highest education level: Not on file  Occupational History   Not on file  Tobacco Use   Smoking status: Never   Smokeless tobacco: Never  Vaping Use  Vaping Use: Never used  Substance and Sexual Activity   Alcohol use: Yes    Comment: occassional   Drug use: Never   Sexual activity: Not on file  Other Topics Concern   Not on file  Social History Narrative   Not on file   Social Determinants of Health   Financial Resource Strain: Not on file  Food Insecurity: Not on file  Transportation Needs: Not on file  Physical Activity: Not on file  Stress: Not on file  Social Connections: Not on file  Intimate Partner Violence: Not on file    Medications:   Current Outpatient Medications on File Prior to  Visit  Medication Sig Dispense Refill   apixaban (ELIQUIS) 5 MG TABS tablet Take 1 tablet (5 mg total) by mouth 2 (two) times daily. 180 tablet 0   AZOPT 1 % ophthalmic suspension Place 1 drop into both eyes 2 (two) times daily.     brimonidine (ALPHAGAN P) 0.1 % SOLN Place 1 drop into both eyes 2 (two) times daily.     Continuous Blood Gluc Receiver (DEXCOM G6 RECEIVER) DEVI      Continuous Blood Gluc Sensor (DEXCOM G6 SENSOR) MISC SMARTSIG:1 Each Topical Every 10 Days     Continuous Blood Gluc Transmit (DEXCOM G6 TRANSMITTER) MISC      Dapagliflozin Propanediol (FARXIGA PO) Take 10 mg/day by mouth daily.     HUMALOG KWIKPEN 100 UNIT/ML KiwkPen Inject 22-24 Units into the skin 3 (three) times daily.     Insulin Glargine 300 UNIT/ML SOPN Inject 16-17 Units into the skin daily after supper.     latanoprost (XALATAN) 0.005 % ophthalmic solution Place 1 drop into both eyes at bedtime.      Loratadine 10 MG CAPS Take 10 mg by mouth daily at 12 noon.     losartan (COZAAR) 100 MG tablet Take 100 mg by mouth daily.     metFORMIN (GLUCOPHAGE-XR) 500 MG 24 hr tablet 1,000 mg daily after supper.     Multiple Vitamins-Minerals (CENTRUM SILVER 50+MEN) TABS Take 1 tablet by mouth daily.     nebivolol (BYSTOLIC) 5 MG tablet Take 0.5 tablets (2.5 mg total) by mouth daily. Hold this medication until you see your PCP and/or cardiologist. HR has been on lower end     NOVOFINE 32G X 6 MM MISC      ONE TOUCH ULTRA TEST test strip      simvastatin (ZOCOR) 20 MG tablet Take 20 mg by mouth at bedtime.  5   triamterene-hydrochlorothiazide (MAXZIDE-25) 37.5-25 MG tablet Take 1 tablet by mouth daily.     TRULICITY 0.75 MG/0.5ML SOPN Inject 0.75 mg into the skin every Sunday.     ezetimibe (ZETIA) 10 MG tablet Take 1 tablet (10 mg total) by mouth daily. 30 tablet 0   No current facility-administered medications on file prior to visit.    Allergies:  No Known Allergies  Physical Exam General: Obese middle-aged  Caucasian male, seated, in no evident distress Head: head normocephalic and atraumatic.   Neck: supple with no carotid or supraclavicular bruits Cardiovascular: regular rate and rhythm, no murmurs Musculoskeletal: no deformity Skin:  no rash/petichiae Vascular:  Normal pulses all extremities  Neurologic Exam Mental Status: Awake and fully alert. Oriented to place and time. Recent and remote memory intact. Attention span, concentration and fund of knowledge appropriate. Mood and affect appropriate.  Cranial Nerves: Fundoscopic exam reveals sharp disc margins. Pupils equal, briskly reactive to light.  Vision acuity is diminished  in the right eye in the central field but preserved in the periphery.  Extraocular movements full without nystagmus. Visual fields full to confrontation. Hearing intact. Facial sensation intact. Face, tongue, palate moves normally and symmetrically.  Motor: Normal bulk and tone. Normal strength in all tested extremity muscles. Sensory.: intact to touch , pinprick , position and vibratory sensation.  Coordination: Rapid alternating movements normal in all extremities. Finger-to-nose and heel-to-shin performed accurately bilaterally. Gait and Station: Arises from chair without difficulty. Stance is normal. Gait demonstrates normal stride length and balance . Able to heel, toe and tandem walk without difficulty.  Reflexes: 1+ and symmetric except ankle jerks are depressed bilaterally. Toes downgoing.   NIHSS  0 Modified Rankin  0   ASSESSMENT: 81 year old Caucasian male with recurrent left upper extremity incoordination and dystonic posturing due to small right perirolandic cortex embolic infarct from recently diagnosed atrial fibrillation/flutter.  Vascular risk factors of hyperlipidemia, hypertension, diabetes and obesity and atrial fibrillation/flutter.     PLAN: I had a long d/w patient about his recent cardioembolic stroke,new diagnosis of atrial fibrillation, risk  for recurrent stroke/TIAs, personally independently reviewed imaging studies and stroke evaluation results and answered questions.Continue Eliquis (apixaban)  5 mg twice daily  for secondary stroke prevention and maintain strict control of hypertension with blood pressure goal below 130/90, diabetes with hemoglobin A1c goal below 6.5% and lipids with LDL cholesterol goal below 70 mg/dL. I also advised the patient to eat a healthy diet with plenty of whole grains, cereals, fruits and vegetables, exercise regularly and maintain ideal body weight .check polysomnogram for sleep apnea as he appears at risk and EEG for seizure activity.  Continue follow-up with cardiology for his A-fib/flutter management.  Followup in the future with me in 3 months or call earlier if necessary.  Greater than 50% time during this 45-minute consultation visit was spent in counseling and coordination of care about his embolic stroke and recent diagnosis of atrial fibrillation/flutter and answering questions.  Delia Heady, MD Note: This document was prepared with digital dictation and possible smart phrase technology. Any transcriptional errors that result from this process are unintentional.

## 2021-12-17 NOTE — Telephone Encounter (Signed)
Referral for Sleep Study sent to [facility]. 

## 2021-12-19 ENCOUNTER — Ambulatory Visit (INDEPENDENT_AMBULATORY_CARE_PROVIDER_SITE_OTHER): Payer: Medicare Other | Admitting: Neurology

## 2021-12-19 DIAGNOSIS — R258 Other abnormal involuntary movements: Secondary | ICD-10-CM | POA: Diagnosis not present

## 2021-12-19 DIAGNOSIS — G249 Dystonia, unspecified: Secondary | ICD-10-CM

## 2022-01-09 ENCOUNTER — Telehealth: Payer: Self-pay | Admitting: Neurology

## 2022-01-09 NOTE — Telephone Encounter (Signed)
LVM and mychart msg informing pt of need to reschedule 9/20 appointment - MD out 

## 2022-01-25 ENCOUNTER — Other Ambulatory Visit: Payer: Self-pay | Admitting: Cardiovascular Disease

## 2022-01-25 MED ORDER — APIXABAN 5 MG PO TABS: 5.0000 mg | ORAL_TABLET | Freq: Two times a day (BID) | ORAL | 3 refills | Status: DC

## 2022-01-29 ENCOUNTER — Ambulatory Visit (INDEPENDENT_AMBULATORY_CARE_PROVIDER_SITE_OTHER): Payer: Medicare Other | Admitting: Neurology

## 2022-01-29 ENCOUNTER — Encounter: Payer: Self-pay | Admitting: Neurology

## 2022-01-29 VITALS — BP 124/81 | HR 43 | Ht 70.0 in | Wt 232.4 lb

## 2022-01-29 DIAGNOSIS — Z9189 Other specified personal risk factors, not elsewhere classified: Secondary | ICD-10-CM

## 2022-01-29 DIAGNOSIS — I639 Cerebral infarction, unspecified: Secondary | ICD-10-CM | POA: Diagnosis not present

## 2022-01-29 DIAGNOSIS — R351 Nocturia: Secondary | ICD-10-CM

## 2022-01-29 DIAGNOSIS — I480 Paroxysmal atrial fibrillation: Secondary | ICD-10-CM | POA: Diagnosis not present

## 2022-01-29 DIAGNOSIS — E669 Obesity, unspecified: Secondary | ICD-10-CM

## 2022-01-29 DIAGNOSIS — R0683 Snoring: Secondary | ICD-10-CM

## 2022-01-29 DIAGNOSIS — Z820 Family history of epilepsy and other diseases of the nervous system: Secondary | ICD-10-CM

## 2022-01-29 NOTE — Progress Notes (Signed)
Subjective:    Patient ID: Jeffery Beltran is a 81 y.o. male.  HPI    Jeffery Foley, MD, PhD [facility] [address] [address] [place], [place] [address]   Dear Jeffery Beltran,   I saw your patient, Jeffery Beltran, upon your kind request in my sleep clinic today for initial consultation of his sleep disorder, in particular, concern for underlying obstructive sleep apnea.  The patient is accompanied by his wife today.  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  I reviewed your office note from 12/17/2021.  His Epworth sleepiness score is 4 out of 24, fatigue severity score is 17 out of 63.  He goes to bed around 10 or 11, rise time around 7.  His older sister has sleep apnea but has no CPAP machine.  He reports nocturia about twice per average night.  He is a non-smoker and drinks caffeine in the form of diet soda, 1 16 ounce water per day on average.  He drinks alcohol occasionally, maybe once or twice a month.  He lives with his wife.  He is reluctant to pursue sleep testing.  He would not want to use a CPAP machine.  We talked about alternatives at length today as well.  He has had some weight loss after starting Trulicity about 4 months ago or so.  He lost about 15 pounds in the past for 5 months.  He denies nocturnal or morning headaches.  His Past Medical History Is Significant For: Past Medical History:  Diagnosis Date   Cataract 12/17/2021   Diabetes (HCC)    Glaucoma    Hypertension    Stroke (HCC)     His Past Surgical History Is Significant For: Past Surgical History:  Procedure Laterality Date   CATARACT EXTRACTION Right    EYE SURGERY Right    Cat Sx   EYE SURGERY Right    Trab   IRIDOTOMY / IRIDECTOMY Right    Trab    TRABECULECTOMY Right    YAG LASER APPLICATION Right     His Family History Is Significant For: Family History  Problem Relation Age of Onset   Sleep apnea Sister    Amblyopia Neg Hx    Blindness Neg Hx    Cataracts Neg Hx    Glaucoma Neg Hx    Macular degeneration Neg Hx    Retinal detachment Neg Hx    Strabismus Neg Hx    Retinitis pigmentosa Neg Hx     His Social History Is Significant For: Social History   Socioeconomic History   Marital status: Married    Spouse name: Not on file   Number of children: Not on file   Years of education: Not on file   Highest education level: Not on file  Occupational History   Not on file  Tobacco Use   Smoking status: Never   Smokeless tobacco: Never  Vaping Use   Vaping Use: Never used  Substance and Sexual Activity   Alcohol use: Yes    Comment: occassional   Drug use: Never   Sexual activity: Not on file  Other Topics Concern   Not on file  Social History Narrative   Not on file   Social Determinants of Health   Financial Resource Strain: Not on file  Food Insecurity: Not  on file  Transportation Needs: Not on file  Physical Activity: Not on file  Stress: Not on file  Social Connections: Not on file    His Allergies Are:  No Known Allergies:   His Current Medications Are:  Outpatient Encounter Medications as of 01/29/2022  Medication Sig   apixaban (ELIQUIS) 5 MG TABS tablet Take 1 tablet (5 mg total) by mouth 2 (two) times daily.   AZOPT 1 % ophthalmic suspension Place 1 drop into both eyes 2 (two) times daily.   brimonidine (ALPHAGAN P) 0.1 % SOLN Place 1 drop into both eyes 2 (two) times daily.   Continuous Blood Gluc Receiver (DEXCOM G6 RECEIVER) DEVI    Continuous Blood Gluc Sensor (DEXCOM G6 SENSOR) MISC SMARTSIG:1 Each Topical Every 10 Days   Continuous Blood Gluc Transmit (DEXCOM G6 TRANSMITTER) MISC    Dapagliflozin Propanediol (FARXIGA PO) Take 10 mg/day by mouth daily.   HUMALOG KWIKPEN 100 UNIT/ML  KiwkPen Inject 22-24 Units into the skin 3 (three) times daily.   Insulin Glargine 300 UNIT/ML SOPN Inject 16-17 Units into the skin daily after supper.   latanoprost (XALATAN) 0.005 % ophthalmic solution Place 1 drop into both eyes at bedtime.    Loratadine 10 MG CAPS Take 10 mg by mouth daily at 12 noon.   losartan (COZAAR) 100 MG tablet Take 100 mg by mouth daily.   metFORMIN (GLUCOPHAGE-XR) 500 MG 24 hr tablet 1,000 mg daily after supper.   Multiple Vitamins-Minerals (CENTRUM SILVER 50+MEN) TABS Take 1 tablet by mouth daily.   nebivolol (BYSTOLIC) 5 MG tablet Take 0.5 tablets (2.5 mg total) by mouth daily. Hold this medication until you see your PCP and/or cardiologist. HR has been on lower end   NOVOFINE 32G X 6 MM MISC    ONE TOUCH ULTRA TEST test strip    simvastatin (ZOCOR) 20 MG tablet Take 20 mg by mouth at bedtime.   triamterene-hydrochlorothiazide (MAXZIDE-25) 37.5-25 MG tablet Take 1 tablet by mouth daily.   TRULICITY 0.75 MG/0.5ML SOPN Inject 0.75 mg into the skin every Sunday.   ezetimibe (ZETIA) 10 MG tablet Take 1 tablet (10 mg total) by mouth daily.   No facility-administered encounter medications on file as of 01/29/2022.  :   Review of Systems:  Out of a complete 14 point review of systems, all are reviewed and negative with the exception of these symptoms as listed below:   Review of Systems  Neurological:        Pt here for sleep consult Pt snore,hypertension,hx of strokes  Pt  denies fatigue ,headaches,sleep study ,CPAP machine Pt states he swims 3x a week     ESS:4 FSS:17    Objective:  Neurological Exam  Physical Exam Physical Examination:   Vitals:   01/29/22 1019  BP: 124/81  Pulse: (!) 43    General Examination: The patient is a very pleasant 81 y.o. male in no acute distress. He appears well-developed and well-nourished and well groomed.   HEENT: Normocephalic, atraumatic, pupils are equal, round and reactive to light, extraocular tracking is  good without limitation to gaze excursion or nystagmus noted. Hearing is grossly intact with right hearing aid in place (left 1 broke). Face is symmetric with normal facial animation. Speech is clear with no dysarthria noted. There is no hypophonia. There is no lip, neck/head, jaw or voice tremor. Neck is supple with full range of passive and active motion. There are no carotid bruits on auscultation. Oropharynx exam reveals: mild mouth dryness, adequate  dental hygiene and moderate airway crowding, due to small airway entry, redundant soft palate and wider uvula noted, tip of uvula and tonsils not fully visualized, Mallampati class IV.  Neck circumference 17 inches.  No significant overbite.  Tongue protrudes centrally.  Chest: Clear to auscultation without wheezing, rhonchi or crackles noted.  Heart: S1+S2+0, regular and normal without murmurs, rubs or gallops noted.   Abdomen: Soft, non-tender and non-distended with normal bowel sounds appreciated on auscultation.  Extremities: There is 1+ pitting edema in the distal lower extremities bilaterally, right more so than left.   Skin: Warm and dry without trophic changes noted.   Musculoskeletal: exam reveals no obvious joint deformities, tenderness or joint swelling or erythema.   Neurologically:  Mental status: The patient is awake, alert and oriented in all 4 spheres. His immediate and remote memory, attention, language skills and fund of knowledge are appropriate. There is no evidence of aphasia, agnosia, apraxia or anomia. Speech is clear with normal prosody and enunciation. Thought process is linear. Mood is normal and affect is normal.  Cranial nerves II - XII are as described above under HEENT exam.  Motor exam: Normal bulk, strength and tone is noted. There is no obvious tremor. Fine motor skills and coordination: grossly intact.  Cerebellar testing: No dysmetria or intention tremor. There is no truncal or gait ataxia.  Sensory exam: intact  to light touch in the upper and lower extremities.  Gait, station and balance: He stands easily. No veering to one side is noted. No leaning to one side is noted. Posture is age-appropriate and stance is narrow based. Gait shows normal stride length and normal pace. No problems turning are noted.   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 A laboratory attended sleep study is considered gold standard for evaluation of sleep disordered breathing and is recommended at this time and clinically justified.  He declines testing at this time and would like to discuss this with his primary care physician first. I had a long chat with the patient and his wife about my findings and the diagnosis of sleep apnea, particularly OSA, its prognosis and treatment options. We talked about medical/conservative treatments, surgical interventions and non-pharmacological approaches for symptom control. I explained, in particular, the risks and ramifications of untreated moderate to severe OSA, especially with respect to developing cardiovascular disease down the road, including congestive heart failure (CHF), difficult to treat hypertension, cardiac arrhythmias (particularly A-fib), neurovascular complications including TIA, stroke and dementia. Even type 2 diabetes has, in part, been linked to untreated OSA. Symptoms of untreated OSA may include (but may not be limited to) daytime sleepiness, nocturia (i.e. frequent  nighttime urination), memory problems, mood irritability and suboptimally controlled or worsening mood disorder such as depression and/or anxiety, lack of energy, lack of motivation, physical discomfort, as well as recurrent headaches, especially morning or nocturnal headaches.  He was given instructions in detail in his after visit summary. I outlined the differences between a laboratory attended sleep study which is considered more comprehensive and accurate over the option of a home sleep test (  HST); the latter may lead to underestimation of sleep disordered breathing in some instances and does not help with diagnosing upper airway resistance syndrome and is not accurate enough to diagnose primary central sleep apnea typically.  We talked about different treatment options, including the use of a pressure airway pressure (PAP) device (ie CPAP, AutoPAP/APAP or BiPAP in certain circumstances), a custom-made dental device (aka oral appliance, which would require a referral to a specialist dentist or orthodontist typically, and is generally speaking not considered a good choice for patients with full dentures or edentulous state), upper airway surgical options, such as traditional UPPP (which is not considered a first-line treatment) or the Inspire device (hypoglossal nerve stimulator, which would involve a referral for consultation with an ENT surgeon, after careful selection, following inclusion criteria).  We will pick up our discussion in the near future.  He would like to discuss these issues with his primary care physician first.  He is encouraged to call our office when he is ready to pursue sleep testing.  He is advised that most likely first-line treatment recommendation will be a form of PAP machine.   I answered all their questions today and the patient and his wife were in agreement. Thank you very much for allowing me to participate in the care of this nice patient. If I can be of any further  assistance to you please do not hesitate to call me at [phone].  Sincerely,   Jeffery Foley, MD, PhD

## 2022-01-29 NOTE — Patient Instructions (Signed)

## 2022-01-31 NOTE — Progress Notes (Signed)
[facility] Note  02/04/2022     CHIEF COMPLAINT Patient presents for Retina Follow Up    HISTORY OF PRESENT ILLNESS: Jeffery Beltran is a 81 y.o. male who presents to the clinic today for:  HPI     Retina Follow Up   Patient presents with  Other.  In left eye.  This started 8 months ago.  I, the attending physician,  performed the HPI with the patient and updated documentation appropriately.        Comments   Patient here for 8 months retina follow up for CME OS. Patient states vision is fine. 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 Effected left had only. Due to A-fib. On Eliquis. No eye pain.       Last edited by Rennis Chris, MD on 02/04/2022 12:16 PM.    Pt was supposed to see Dr. Joelene Millin at the beginning of the month, but he was sick, so he has an appt with her on 02/25/22, vision is stable, pt had a stroke on April 5th, he states 3 fingers on his left hand were affected, but now they are back to normal, he is now on Eliquis   Referring physician: A. Melissa Solum [address] [place], [place] [address]  HISTORICAL INFORMATION:  Selected notes from the MEDICAL RECORD NUMBER Referral from Dr. 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   brimonidine (ALPHAGAN P) 0.1 % SOLN Place 1 drop into both eyes 2 (two) times daily.   latanoprost (XALATAN) 0.005 % ophthalmic solution Place 1 drop into both eyes at bedtime.    No current  facility-administered medications for this visit. (Ophthalmic Drugs)   Current Outpatient Medications (Other)  Medication Sig   apixaban (ELIQUIS) 5 MG TABS tablet Take 1 tablet (5 mg total) by mouth 2 (two) times daily.   Continuous Blood Gluc Receiver (DEXCOM G6 RECEIVER) DEVI    Continuous Blood Gluc Sensor (DEXCOM G6 SENSOR) MISC SMARTSIG:1 Each Topical Every 10 Days   Continuous Blood Gluc Transmit (DEXCOM G6 TRANSMITTER) MISC    Dapagliflozin Propanediol (FARXIGA PO) Take 10 mg/day by mouth daily.   HUMALOG KWIKPEN 100 UNIT/ML KiwkPen Inject 22-24 Units into the skin 3 (three) times daily.   Insulin Glargine 300 UNIT/ML SOPN Inject 16-17 Units into the skin daily after supper.   Loratadine 10 MG CAPS Take 10 mg by mouth daily at 12 noon.   losartan (COZAAR) 100 MG tablet Take 100 mg by mouth daily.   metFORMIN (GLUCOPHAGE-XR) 500 MG 24 hr tablet 1,000 mg daily after supper.   Multiple Vitamins-Minerals (CENTRUM SILVER 50+MEN) TABS Take 1 tablet by mouth daily.   nebivolol (BYSTOLIC) 5 MG tablet Take 0.5 tablets (2.5 mg total) by mouth daily. Hold this medication until you see your PCP and/or cardiologist. HR has been on lower end   NOVOFINE 32G X 6 MM MISC  ONE TOUCH ULTRA TEST test strip    simvastatin (ZOCOR) 20 MG tablet Take 20 mg by mouth at bedtime.   triamterene-hydrochlorothiazide (MAXZIDE-25) 37.5-25 MG tablet Take 1 tablet by mouth daily.   TRULICITY [id number] SOPN Inject 0.75 mg into the skin every Sunday.   ezetimibe (ZETIA) 10 MG tablet Take 1 tablet (10 mg total) by mouth daily.   No current facility-administered medications for this visit. (Other)    REVIEW OF SYSTEMS: ROS   Positive for: Eyes Negative for: Constitutional, Gastrointestinal, Neurological, Skin, Genitourinary, Musculoskeletal, HENT, Endocrine, Cardiovascular, Respiratory, Psychiatric, Allergic/Imm, Heme/Lymph Last edited by Theodore Demark, COA on 02/04/2022  9:04 AM.     ALLERGIES No Known  Allergies  PAST MEDICAL HISTORY Past Medical History:  Diagnosis Date   Cataract 12/17/2021   Diabetes ([place])    Glaucoma    Hypertension    Stroke [facility])    Past Surgical History:  Procedure Laterality Date   CATARACT EXTRACTION Right    EYE SURGERY Right    Cat Sx   EYE SURGERY Right    Trab   IRIDOTOMY / IRIDECTOMY Right    Trab   TRABECULECTOMY Right    YAG LASER APPLICATION Right    FAMILY HISTORY Family History  Problem Relation Age of Onset   Sleep apnea Sister    Amblyopia Neg Hx    Blindness Neg Hx    Cataracts Neg Hx    Glaucoma Neg Hx    Macular degeneration Neg Hx    Retinal detachment Neg Hx    Strabismus Neg Hx    Retinitis pigmentosa Neg Hx    SOCIAL HISTORY Social History   Tobacco Use   Smoking status: Never   Smokeless tobacco: Never  Vaping Use   Vaping Use: Never used  Substance Use Topics   Alcohol use: Yes    Comment: occassional   Drug use: Never       OPHTHALMIC EXAM:  Base Eye Exam     Visual Acuity (Snellen - Linear)       Right Left   Dist cc 20/350 20/20 -1   Dist ph cc NI     Correction: Glasses         Tonometry (Tonopen, 9:00 AM)       Right Left   Pressure 16 13         Pupils       Dark Light Shape React APD   Right 3 2 Round Brisk None   Left 3 2 Round Brisk None         Visual Fields (Counting fingers)       Left Right    Full Full         Extraocular Movement       Right Left    Full, Ortho Full, Ortho         Neuro/Psych     Oriented x3: Yes   Mood/Affect: Normal         Dilation     Both eyes: 1.0% Mydriacyl, 2.5% Phenylephrine @ 9:00 AM           Slit Lamp and Fundus Exam     External Exam       Right Left   External Normal Normal         Slit Lamp Exam       Right Left   Lids/Lashes Dermatochalasis - upper lid Dermatochalasis - upper lid, mild Meibomian gland dysfunction   Conjunctiva/Sclera Bleb  at 1200 White and quiet   Cornea Trace Punctate  epithelial erosions, Well healed cataract wounds, arcus, tear film debris Arcus, trace Punctate epithelial erosions, well healed cataract wound, trace Descemet's folds, mild tear film debris   Anterior Chamber Deep and quiet Deep and clear   Iris Round and moderately dilated, No NVI Round and moderately dilated   Lens Posterior chamber intraocular lens; open PC, mild anterior capsular phimosis toric PCIOL w/marks at 0315 and 0915, ant. capsule  phimosis, 1+PCO   Anterior Vitreous Vitreous syneresis - mild Posterior vitreous detachment, Weiss ring, Vitreous syneresis         Fundus Exam       Right Left   Disc Pink and Sharp, Compact Pink and Sharp, compact, temporal Peripapillary atrophy, +disc drusen   C/D Ratio 0.1 0.1   Macula Flat, Blunted foveal reflex, central RPE mottling, clumping and atrophy, mild ERM temporal macula, No heme or edema Flat, good foveal reflex, mild Retinal pigment epithelial mottling and clumping, trace Cystic changes -- stably resolved, No heme   Vessels attenuated, Tortuous attenuated, Tortuous   Periphery Attached, reticular degeneration, No heme Attached, mild reticular degeneration, No heme           Refraction     Wearing Rx       Sphere Cylinder Axis Add   Right -0.75 +1.00 003 +2.25   Left -0.50 +0.50 015 +2.25            IMAGING AND PROCEDURES  Imaging and Procedures for 08/22/17  OCT, Retina - OU - Both Eyes       Right Eye Quality was good. Central Foveal Thickness: 325. Progression has been stable. Findings include no IRF, no SRF, abnormal foveal contour, retinal drusen , subretinal hyper-reflective material, epiretinal membrane, macular pucker, outer retinal atrophy.   Left Eye Quality was good. Central Foveal Thickness: 341. Progression has been stable. Findings include normal foveal contour, no IRF, no SRF (Stable improvement in central cystic changes).   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 CME - cystoid macular edema. PED - pigment epithelial detachment. IRF - intraretinal fluid. SRF - subretinal fluid. EZ - ellipsoid zone. ERM - epiretinal membrane. ORA - outer retinal atrophy. ORT - outer retinal tubulation. SRHM - subretinal hyper-reflective material             ASSESSMENT/PLAN:   ICD-10-CM   1. CME (cystoid macular edema), left  H35.352 OCT, Retina - OU - Both Eyes    2. 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 Type 2 diabetes mellitus without complication, unspecified whether long term insulin use (HCC)  E11.9     4. Epiretinal membrane (ERM) of right eye  H35.371     5. Ocular hypertension, unspecified laterality  H40.059     6. History of trabeculectomy  Z98.890     7. Pseudophakia, both eyes  Z96.1     8. 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 CME OS  - s/p CEIOL OS (~7.13.22, Dr. Danne Baxter)  - completed steroid taper w/ Dr. Danne Baxter  - OCT showed +cystic changes / CME             - today,  cystic changes stably resolved on OCT -- off Lotemax SM and Prolensa  - BCVA 20/20 OS  - recommend continuing to hold latanoprost OS due to PGA-associated CME  - f/u in 1 year  2.  History of CRVO w/ CME OD -- stable  - VA stable at 20/300- today  - history of intravitreal steroids and steroid response - see below  - OCT stable today: central foveal outer retinal atrophy but no IRF/SRF/edema  - monitor  - f/u in 12 months  3. Diabetes mellitus, type 2 without retinopathy  - The incidence, risk factors for progression, natural history and treatment options for diabetic retinopathy  were discussed with patient.    - The need for close monitoring of blood glucose, blood pressure, and serum lipids, avoiding cigarette or  any type of tobacco, and the need for long term follow up was also discussed with patient.  - f/u in 1 year, sooner prn  4. ERM OD  - mild ERM/pucker in right eye that is limited by central atrophy 2/2 h/o CRVO  - monitor--no indication for surgery at this time  - will discuss if symptoms develop or worsen OD  5,6.  Steroid response / Ocular Hypertension s/p trabeculectomy OD  - IOP OD okay today -- 16 mmHg  - using Alphagan OU BID, Xalatan OD qd, Azopt OS BID as perscribed by Dr. Joelene Millin  - followed by Dr. Vena Austria at [facility] at [address] [place] [address], telephone [phone] [phone]  - monitor  7.  Pseudophakia OU  - s/p CE/IOL OU (Dr. Vena Austria)  - s/p yag capsulotomy OD  - beautiful surgeries  - ?rebound CME OS as above  - monitor  8. Optic disc drusen OS  - discussed diagnosis and significance to vision  - not visually significant at this point  - monitor  Ophthalmic Meds Ordered this visit:  No orders of the defined types were placed in this encounter.    Return for f/u 1 year, CME OS, DFE, OCT.  There are no Patient Instructions on file for this visit.  This document serves as a record of services personally performed by Karie Chimera, MD, PhD. It was created on their behalf by Annalee Genta, COMT. The creation of this record is the provider's dictation and/or activities during the visit.  Electronically signed by: Annalee Genta, COMT 02/04/22 12:16 PM  This document serves as a record of services personally performed by Karie Chimera, MD, PhD. It was created on their behalf by Glee Arvin. Manson Passey, OA an ophthalmic technician. The creation of this record is the provider's dictation and/or activities during the visit.    Electronically signed by: Glee Arvin. Manson Passey, [place] 07.31.2023 12:16 PM  Karie Chimera, M.D., Ph.D. Diseases & Surgery of the Retina and Vitreous [facility]  I have reviewed the above documentation for accuracy and completeness, and I agree with the above. Karie Chimera, M.D., Ph.D. 02/04/22 12:18 PM  Abbreviations: M myopia (nearsighted); A astigmatism; H hyperopia (farsighted); P presbyopia; Mrx spectacle prescription;  CTL contact lenses; OD right eye; OS left eye; OU both eyes  XT exotropia; ET esotropia; PEK punctate epithelial keratitis; PEE punctate epithelial erosions; DES dry eye syndrome; MGD meibomian gland dysfunction; ATs artificial tears; PFAT's preservative free artificial tears; NSC nuclear sclerotic cataract; PSC posterior subcapsular cataract; ERM epi-retinal membrane; PVD posterior vitreous detachment; RD retinal detachment; DM diabetes mellitus; DR diabetic  retinopathy; NPDR non-proliferative diabetic retinopathy; PDR proliferative diabetic retinopathy; CSME clinically significant macular edema; DME diabetic macular edema; dbh dot blot hemorrhages; CWS cotton wool spot; POAG primary open angle glaucoma; C/D cup-to-disc ratio; HVF humphrey visual field; GVF goldmann visual field; OCT optical coherence tomography; IOP intraocular pressure; BRVO Branch retinal vein occlusion; CRVO central retinal vein occlusion; CRAO central retinal artery occlusion; BRAO branch retinal artery occlusion; RT retinal tear; SB scleral buckle; PPV pars plana vitrectomy; VH Vitreous hemorrhage; PRP panretinal laser photocoagulation; IVK intravitreal kenalog; VMT vitreomacular traction; MH Macular hole;  NVD neovascularization of the disc; NVE neovascularization elsewhere; AREDS age related eye disease study; ARMD age related macular degeneration; POAG primary open angle glaucoma; EBMD epithelial/anterior basement membrane dystrophy; ACIOL anterior chamber intraocular lens; IOL intraocular lens; PCIOL posterior chamber intraocular lens; Phaco/IOL phacoemulsification with intraocular lens placement; [place] photorefractive keratectomy; LASIK laser assisted in  situ keratomileusis; HTN hypertension; DM diabetes mellitus; COPD chronic obstructive pulmonary disease

## 2022-02-04 ENCOUNTER — Ambulatory Visit (INDEPENDENT_AMBULATORY_CARE_PROVIDER_SITE_OTHER): Payer: Medicare Other | Admitting: Ophthalmology

## 2022-02-04 ENCOUNTER — Encounter (INDEPENDENT_AMBULATORY_CARE_PROVIDER_SITE_OTHER): Payer: Self-pay | Admitting: Ophthalmology

## 2022-02-04 DIAGNOSIS — H40059 Ocular hypertension, unspecified eye: Secondary | ICD-10-CM

## 2022-02-04 DIAGNOSIS — H34811 Central retinal vein occlusion, right eye, with macular edema: Secondary | ICD-10-CM

## 2022-02-04 DIAGNOSIS — H47322 Drusen of optic disc, left eye: Secondary | ICD-10-CM

## 2022-02-04 DIAGNOSIS — Z9889 Other specified postprocedural states: Secondary | ICD-10-CM

## 2022-02-04 DIAGNOSIS — H35352 Cystoid macular degeneration, left eye: Secondary | ICD-10-CM

## 2022-02-04 DIAGNOSIS — H35371 Puckering of macula, right eye: Secondary | ICD-10-CM

## 2022-02-04 DIAGNOSIS — E119 Type 2 diabetes mellitus without complications: Secondary | ICD-10-CM

## 2022-02-04 DIAGNOSIS — Z961 Presence of intraocular lens: Secondary | ICD-10-CM

## 2022-03-27 ENCOUNTER — Ambulatory Visit: Payer: Medicare Other | Admitting: Neurology

## 2022-05-09 ENCOUNTER — Encounter: Payer: Self-pay | Admitting: Cardiovascular Disease

## 2022-05-09 ENCOUNTER — Ambulatory Visit: Payer: Medicare Other | Attending: Cardiovascular Disease | Admitting: Cardiovascular Disease

## 2022-05-09 VITALS — BP 122/90 | HR 74 | Ht 70.0 in | Wt 239.0 lb

## 2022-05-09 DIAGNOSIS — I10 Essential (primary) hypertension: Secondary | ICD-10-CM | POA: Insufficient documentation

## 2022-05-09 DIAGNOSIS — E785 Hyperlipidemia, unspecified: Secondary | ICD-10-CM | POA: Diagnosis not present

## 2022-05-09 DIAGNOSIS — I639 Cerebral infarction, unspecified: Secondary | ICD-10-CM | POA: Insufficient documentation

## 2022-05-09 DIAGNOSIS — I480 Paroxysmal atrial fibrillation: Secondary | ICD-10-CM | POA: Diagnosis not present

## 2022-05-09 NOTE — Progress Notes (Signed)
Cardiology Office Note   Date:  05/09/2022   ID:  Jeffery Beltran, DOB 12/20/1940, MRN [id number]  PCP:  Kandyce Rud, MD  Cardiologist:   Lorine Bears, MD   Chief Complaint  Patient presents with   Follow-up    6 month F/U-No new cardiac concerns      History of Present Illness: Jeffery Beltran is a 81 y.o. male who presents for a follow-up visit regarding paroxysmal atrial fibrillation.   He has past medical history of essential hypertension, hyperlipidemia, type 2 diabetes and obesity.  He was hospitalized in April with acute onset of incoordination of his left hand.  Symptoms resolved quickly and he was diagnosed with TIA.  Brain imaging showed cortical diffusion restriction in the right perirolandic sulcus.  The location was suggestive of an embolic etiology.  Echocardiogram showed normal LV systolic function with mild mitral regurgitation and no evidence of PFO.  He was discharged home on aspirin and clopidogrel.  He had outpatient ZIO monitor done which showed paroxysmal atrial fibrillation.  He was switched from dual antiplatelet therapy to Eliquis 5 mg twice daily.  Shortly after that, he had recurrent TIA symptoms and was briefly hospitalized but did not require any intervention.  He has been doing well since then with no recurrent symptoms.  He is noted to be in atrial fibrillation today but he is asymptomatic.  The last time he was in sinus rhythm his heart rate was in the low 40s.  Thus, Bystolic was decreased by Dr. Larwance Sachs from 5 mg to 2.5 mg once daily.  He denies chest pain or shortness of breath.  He continues to be very active and swims 1 mile at least 3 times a week.   Past Medical History:  Diagnosis Date   Cataract 12/17/2021   Diabetes (HCC)    Glaucoma    Hypertension    Stroke [facility])     Past Surgical History:  Procedure Laterality Date   CATARACT EXTRACTION Right    EYE SURGERY Right    Cat Sx   EYE SURGERY Right    Trab   IRIDOTOMY /  IRIDECTOMY Right    Trab   TRABECULECTOMY Right    YAG LASER APPLICATION Right      Current Outpatient Medications  Medication Sig Dispense Refill   apixaban (ELIQUIS) 5 MG TABS tablet Take 1 tablet (5 mg total) by mouth 2 (two) times daily. 180 tablet 3   AZOPT 1 % ophthalmic suspension Place 1 drop into both eyes 2 (two) times daily.     brimonidine (ALPHAGAN P) 0.1 % SOLN Place 1 drop into both eyes 2 (two) times daily.     Continuous Blood Gluc Receiver (DEXCOM G6 RECEIVER) DEVI      Continuous Blood Gluc Sensor (DEXCOM G6 SENSOR) MISC SMARTSIG:1 Each Topical Every 10 Days     Continuous Blood Gluc Transmit (DEXCOM G6 TRANSMITTER) MISC      Dapagliflozin Propanediol (FARXIGA PO) Take 10 mg/day by mouth daily.     ezetimibe (ZETIA) 10 MG tablet Take 1 tablet (10 mg total) by mouth daily. 30 tablet 0   HUMALOG KWIKPEN 100 UNIT/ML KiwkPen Inject 22-24 Units into the skin 3 (three) times daily.     Insulin Glargine 300 UNIT/ML SOPN Inject 16-17 Units into the skin daily after supper.     latanoprost (XALATAN) 0.005 % ophthalmic solution Place 1 drop into both eyes at bedtime.      Loratadine 10 MG CAPS Take 10 mg  by mouth daily at 12 noon.     losartan (COZAAR) 100 MG tablet Take 100 mg by mouth daily.     metFORMIN (GLUCOPHAGE-XR) 500 MG 24 hr tablet 1,000 mg daily after supper.     Multiple Vitamins-Minerals (CENTRUM SILVER 50+MEN) TABS Take 1 tablet by mouth daily.     nebivolol (BYSTOLIC) 5 MG tablet Take 0.5 tablets (2.5 mg total) by mouth daily. Hold this medication until you see your PCP and/or cardiologist. HR has been on lower end     NOVOFINE 32G X 6 MM MISC      ONE TOUCH ULTRA TEST test strip      simvastatin (ZOCOR) 20 MG tablet Take 20 mg by mouth at bedtime.  5   triamterene-hydrochlorothiazide (MAXZIDE-25) 37.5-25 MG tablet Take 1 tablet by mouth daily.     TRULICITY 1.61 WR/6.0AV SOPN Inject 0.75 mg into the skin every Sunday.     No current facility-administered  medications for this visit.    Allergies:   Patient has no known allergies.    Social History:  The patient  reports that he has never smoked. He has never used smokeless tobacco. He reports current alcohol use. He reports that he does not use drugs.   Family History:  The patient's family history is negative for premature coronary artery disease.   ROS:  Please see the history of present illness.   Otherwise, review of systems are positive for none.   All other systems are reviewed and negative.    PHYSICAL EXAM: VS:  BP (!) 122/90 (BP Location: Left Arm, Patient Position: Sitting, Cuff Size: Large)   Pulse 74   Ht 5\' 10"  (1.778 m)   Wt 239 lb (108.4 kg)   SpO2 97%   BMI 34.29 kg/m  , BMI Body mass index is 34.29 kg/m. GEN: Well nourished, well developed, in no acute distress  HEENT: normal  Neck: no JVD, carotid bruits, or masses Cardiac: Irregularly irregular; no murmurs, rubs, or gallops,no edema  Respiratory:  clear to auscultation bilaterally, normal work of breathing GI: soft, nontender, nondistended, + BS MS: no deformity or atrophy  Skin: warm and dry, no rash Neuro:  Strength and sensation are intact Psych: euthymic mood, full affect   EKG:  EKG is ordered today. The ekg ordered today demonstrates atrial fibrillation with PVCs and left axis deviation.   Recent Labs: 10/11/2021: TSH 3.119 11/06/2021: ALT 18; BUN 37; Creatinine, Ser 1.67; Hemoglobin 13.4; Platelets 223; Potassium 3.6; Sodium 137    Lipid Panel    Component Value Date/Time   CHOL 91 11/07/2021 0456   TRIG 92 11/07/2021 0456   HDL 38 (L) 11/07/2021 0456   CHOLHDL 2.4 11/07/2021 0456   VLDL 18 11/07/2021 0456   LDLCALC 35 11/07/2021 0456      Wt Readings from Last 3 Encounters:  05/09/22 239 lb (108.4 kg)  01/29/22 232 lb 6.4 oz (105.4 kg)  12/17/21 237 lb 12.8 oz (107.9 kg)          11/06/2021    3:21 PM  PAD Screen  Previous PAD dx? No  Previous surgical procedure? No  Pain with  walking? No  Feet/toe relief with dangling? No  Painful, non-healing ulcers? No  Extremities discolored? No      ASSESSMENT AND PLAN:  1.  Paroxysmal atrial fibrillation: He is noted to be in atrial fibrillation today but he is completely asymptomatic.  Ventricular rate is controlled on Bystolic 2.5 mg once daily.  His CHA2DS2-VASc score is 6 and thus he is at very high risk for thromboembolic complications related to atrial fibrillation.  Continue long-term anticoagulation with Eliquis 5 mg twice daily.    2.  Essential hypertension: Blood pressure is well controlled on current medications.  3.  Hyperlipidemia: He reports abnormal liver enzymes with atorvastatin in the past but he has been on simvastatin and ezetimibe with no issues.  I reviewed most recent lipid profile done in June which showed an LDL of 46.  4.  Sinus bradycardia when he is not in atrial fibrillation: I agree with decreasing the dose of Bystolic to 2.5 mg once daily.  If he becomes symptomatic from this or if A-fib control becomes difficult, he might ultimately require a pacemaker.    Disposition:   FU in 6 months.  Signed,  Lorine Bears, MD  05/09/2022 8:46 AM    [facility]

## 2022-05-09 NOTE — Patient Instructions (Signed)
Medication Instructions:  Your physician recommends that you continue on your current medications as directed. Please refer to the Current Medication list given to you today.  *If you need a refill on your cardiac medications before your next appointment, please call your pharmacy*   Lab Work: None ordered If you have labs (blood work) drawn today and your tests are completely normal, you will receive your results only by: MyChart Message (if you have MyChart) OR A paper copy in the mail If you have any lab test that is abnormal or we need to change your treatment, we will call you to review the results.   Follow-Up: At [facility], you and your health needs are our priority.  As part of our continuing mission to provide you with exceptional heart care, we have created designated Provider Care Teams.  These Care Teams include your primary Cardiologist (physician) and Advanced Practice Providers (APPs -  Physician Assistants and Nurse Practitioners) who all work together to provide you with the care you need, when you need it.  Your next appointment:   6 month(s)  The format for your next appointment:   In Person  Provider:   Muhammad Arida, MD   Important Information About Sugar       

## 2022-11-07 ENCOUNTER — Encounter: Payer: Self-pay | Admitting: Cardiovascular Disease

## 2022-11-07 ENCOUNTER — Ambulatory Visit: Payer: Medicare Other | Attending: Cardiovascular Disease | Admitting: Cardiovascular Disease

## 2022-11-07 VITALS — BP 120/60 | HR 52 | Ht 70.0 in | Wt 233.2 lb

## 2022-11-07 DIAGNOSIS — I10 Essential (primary) hypertension: Secondary | ICD-10-CM | POA: Diagnosis not present

## 2022-11-07 DIAGNOSIS — I480 Paroxysmal atrial fibrillation: Secondary | ICD-10-CM | POA: Diagnosis not present

## 2022-11-07 DIAGNOSIS — E785 Hyperlipidemia, unspecified: Secondary | ICD-10-CM | POA: Diagnosis not present

## 2022-11-07 MED ORDER — APIXABAN 5 MG PO TABS: 5.0000 mg | ORAL_TABLET | Freq: Two times a day (BID) | ORAL | 3 refills | Status: DC

## 2022-11-07 NOTE — Patient Instructions (Signed)
Medication Instructions:  Refills sent for Eliquis  *If you need a refill on your cardiac medications before your next appointment, please call your pharmacy*   Lab Work: None ordered If you have labs (blood work) drawn today and your tests are completely normal, you will receive your results only by: MyChart Message (if you have MyChart) OR A paper copy in the mail If you have any lab test that is abnormal or we need to change your treatment, we will call you to review the results.   Testing/Procedures: None ordered   Follow-Up: At [facility], you and your health needs are our priority.  As part of our continuing mission to provide you with exceptional heart care, we have created designated Provider Care Teams.  These Care Teams include your primary Cardiologist (physician) and Advanced Practice Providers (APPs -  Physician Assistants and Nurse Practitioners) who all work together to provide you with the care you need, when you need it.  We recommend signing up for the patient portal called "MyChart".  Sign up information is provided on this After Visit Summary.  MyChart is used to connect with patients for Virtual Visits (Telemedicine).  Patients are able to view lab/test results, encounter notes, upcoming appointments, etc.  Non-urgent messages can be sent to your provider as well.   To learn more about what you can do with MyChart, go to [email or web address].    Your next appointment:   6 month(s)  Provider:   You may see Dr. Kirke Corin or one of the following Advanced Practice Providers on your designated Care Team:   Nicolasa Ducking, NP Eula Listen, PA-C Cadence Fransico Michael, PA-C Charlsie Quest, NP

## 2022-11-07 NOTE — Progress Notes (Signed)
Cardiology Office Note   Date:  11/07/2022   ID:  Jeffery Beltran, DOB 31-Oct-1940, MRN [id number]  PCP:  Kandyce Rud, MD  Cardiologist:   Lorine Bears, MD   Chief Complaint  Patient presents with   Follow-up    6 month f/u no complaints today. Meds reviewed verbally with pt.      History of Present Illness: Jeffery Beltran is a 82 y.o. male who presents for a follow-up visit regarding paroxysmal atrial fibrillation.   He has past medical history of essential hypertension, hyperlipidemia, type 2 diabetes and obesity.  He was hospitalized in April with acute onset of incoordination of his left hand.  Symptoms resolved quickly and he was diagnosed with TIA.  Brain imaging showed cortical diffusion restriction in the right perirolandic sulcus.  The location was suggestive of an embolic etiology.  Echocardiogram showed normal LV systolic function with mild mitral regurgitation and no evidence of PFO.  He was discharged home on aspirin and clopidogrel.  He had outpatient ZIO monitor done which showed paroxysmal atrial fibrillation.  He was switched from dual antiplatelet therapy to Eliquis 5 mg twice daily.  Shortly after that, he had recurrent TIA symptoms and was briefly hospitalized but did not require any intervention.    He was noted to be in atrial fibrillation during last visit but he was completely asymptomatic.  He is back into sinus rhythm and cannot tell the difference.  He has resting sinus bradycardia that improved after decreasing the dose of Bystolic.  He denies chest pain or shortness of breath.  He continues to swim 3 times a week for exercise.   Past Medical History:  Diagnosis Date   Cataract 12/17/2021   Diabetes (HCC)    Glaucoma    Hypertension    Stroke [facility])     Past Surgical History:  Procedure Laterality Date   CATARACT EXTRACTION Right    EYE SURGERY Right    Cat Sx   EYE SURGERY Right    Trab   IRIDOTOMY / IRIDECTOMY Right    Trab    TRABECULECTOMY Right    YAG LASER APPLICATION Right      Current Outpatient Medications  Medication Sig Dispense Refill   apixaban (ELIQUIS) 5 MG TABS tablet Take 1 tablet (5 mg total) by mouth 2 (two) times daily. 180 tablet 3   AZOPT 1 % ophthalmic suspension Place 1 drop into both eyes 2 (two) times daily.     brimonidine (ALPHAGAN P) 0.1 % SOLN Place 1 drop into both eyes 2 (two) times daily.     Continuous Blood Gluc Receiver (DEXCOM G6 RECEIVER) DEVI      Continuous Blood Gluc Sensor (DEXCOM G6 SENSOR) MISC SMARTSIG:1 Each Topical Every 10 Days     Continuous Blood Gluc Transmit (DEXCOM G6 TRANSMITTER) MISC      Dapagliflozin Propanediol (FARXIGA PO) Take 10 mg/day by mouth daily.     ezetimibe (ZETIA) 10 MG tablet Take 1 tablet (10 mg total) by mouth daily. 30 tablet 0   HUMALOG KWIKPEN 100 UNIT/ML KiwkPen Inject 22-24 Units into the skin 3 (three) times daily.     Insulin Glargine 300 UNIT/ML SOPN Inject 16-17 Units into the skin daily after supper.     latanoprost (XALATAN) 0.005 % ophthalmic solution Place 1 drop into both eyes at bedtime.      Loratadine 10 MG CAPS Take 10 mg by mouth daily at 12 noon.     losartan (COZAAR) 100 MG tablet  Take 100 mg by mouth daily.     metFORMIN (GLUCOPHAGE-XR) 500 MG 24 hr tablet 1,000 mg daily after supper.     Multiple Vitamins-Minerals (CENTRUM SILVER 50+MEN) TABS Take 1 tablet by mouth daily.     nebivolol (BYSTOLIC) 5 MG tablet Take 0.5 tablets (2.5 mg total) by mouth daily. Hold this medication until you see your PCP and/or cardiologist. HR has been on lower end     NOVOFINE 32G X 6 MM MISC      ONE TOUCH ULTRA TEST test strip      simvastatin (ZOCOR) 20 MG tablet Take 20 mg by mouth at bedtime.  5   triamterene-hydrochlorothiazide (MAXZIDE-25) 37.5-25 MG tablet Take 1 tablet by mouth daily.     TRULICITY 0.75 MG/0.5ML SOPN Inject 0.75 mg into the skin every Sunday.     No current facility-administered medications for this visit.     Allergies:   Patient has no known allergies.    Social History:  The patient  reports that he has never smoked. He has never used smokeless tobacco. He reports current alcohol use. He reports that he does not use drugs.   Family History:  The patient's family history is negative for premature coronary artery disease.   ROS:  Please see the history of present illness.   Otherwise, review of systems are positive for none.   All other systems are reviewed and negative.    PHYSICAL EXAM: VS:  BP 120/60 (BP Location: Left Arm, Patient Position: Sitting, Cuff Size: Large)   Pulse (!) 52   Ht 5\' 10"  (1.778 m)   Wt 233 lb 4 oz (105.8 kg)   SpO2 98%   BMI 33.47 kg/m  , BMI Body mass index is 33.47 kg/m. GEN: Well nourished, well developed, in no acute distress  HEENT: normal  Neck: no JVD, carotid bruits, or masses Cardiac: Regular rate and rhythm; no murmurs, rubs, or gallops,no edema  Respiratory:  clear to auscultation bilaterally, normal work of breathing GI: soft, nontender, nondistended, + BS MS: no deformity or atrophy  Skin: warm and dry, no rash Neuro:  Strength and sensation are intact Psych: euthymic mood, full affect   EKG:  EKG is ordered today. The ekg ordered today demonstrates sinus bradycardia with first-degree AV block   Recent Labs: No results found for requested labs within last 365 days.    Lipid Panel    Component Value Date/Time   CHOL 91 11/07/2021 0456   TRIG 92 11/07/2021 0456   HDL 38 (L) 11/07/2021 0456   CHOLHDL 2.4 11/07/2021 0456   VLDL 18 11/07/2021 0456   LDLCALC 35 11/07/2021 0456      Wt Readings from Last 3 Encounters:  11/07/22 233 lb 4 oz (105.8 kg)  05/09/22 239 lb (108.4 kg)  01/29/22 232 lb 6.4 oz (105.4 kg)          11/06/2021    3:21 PM  PAD Screen  Previous PAD dx? No  Previous surgical procedure? No  Pain with walking? No  Feet/toe relief with dangling? No  Painful, non-healing ulcers? No  Extremities  discolored? No      ASSESSMENT AND PLAN:  1.  Paroxysmal atrial fibrillation: He is back in sinus rhythm today.  Continue small dose Bystolic.  Continue long-term anticoagulation with Eliquis 5 mg twice daily given CHA2DS2-VASc score of 6.  I reviewed his recent labs done in December which showed a creatinine of 1.2.  His hemoglobin was normal.  Eliquis  was refilled today.    2.  Essential hypertension: Blood pressure is well controlled on current medications.  3.  Hyperlipidemia: He reports abnormal liver enzymes with atorvastatin in the past but he has been on simvastatin and ezetimibe with no issues.  I reviewed most recent lipid profile done in December which showed normal liver enzymes and an LDL of 42.  I agree with a target of less than 70 given that he is diabetic.  4.  Sinus bradycardia when he is not in atrial fibrillation: This seems to be stable on small dose of Bystolic.    Disposition:   FU in 6 months.  Signed,  Lorine Bears, MD  11/07/2022 8:51 AM    [facility]

## 2023-02-03 NOTE — Progress Notes (Signed)
[facility] Note  02/05/2023     CHIEF COMPLAINT Patient presents for Retina Follow Up    HISTORY OF PRESENT ILLNESS: Jeffery Beltran is a 82 y.o. male who presents to the clinic today for:  HPI     Retina Follow Up   In left eye.  This started 1 year ago.  Duration of 1 year.  Since onset it is stable.  I, the attending physician,  performed the HPI with the patient and updated documentation appropriately.        Comments   1 year retina follow up CME OS pt is reporting no vision changes noticed he denies any flashes or floaters pt is using Alphagan bid ou Xalatan at bedtime ou and brim bid ou pt saw Dr Joelene Millin few weeks ago pt last reading 100      Last edited by Rennis Chris, MD on 02/05/2023 12:18 PM.    Patient feels the vision is the same. He is using Azopt OU BID,  Alphagan OU BID, and Latanoprost OU at bedtime.   Referring physician: A. Melissa Solum [address] [place], [place] [address]  HISTORICAL INFORMATION:  Selected notes from the MEDICAL RECORD NUMBER Referral from Dr. 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   brimonidine (ALPHAGAN P) 0.1 % SOLN Place 1 drop into both eyes 2 (two) times daily.   latanoprost (XALATAN) 0.005 % ophthalmic solution Place 1 drop into both eyes at bedtime.    No current facility-administered medications for this visit. (Ophthalmic Drugs)   Current Outpatient  Medications (Other)  Medication Sig   apixaban (ELIQUIS) 5 MG TABS tablet Take 1 tablet (5 mg total) by mouth 2 (two) times daily.   Continuous Blood Gluc Receiver (DEXCOM G6 RECEIVER) DEVI    Continuous Blood Gluc Sensor (DEXCOM G6 SENSOR) MISC SMARTSIG:1 Each Topical Every 10 Days   Continuous Blood Gluc Transmit (DEXCOM G6 TRANSMITTER) MISC    Dapagliflozin Propanediol (FARXIGA PO) Take 10 mg/day by mouth daily.   ezetimibe (ZETIA) 10 MG tablet Take 1 tablet (10 mg total) by mouth daily.   HUMALOG KWIKPEN 100 UNIT/ML KiwkPen Inject 22-24 Units into the skin 3 (three) times daily.   Insulin Glargine 300 UNIT/ML SOPN Inject 16-17 Units into the skin daily after supper.   Loratadine 10 MG CAPS Take 10 mg by mouth daily at 12 noon.   losartan (COZAAR) 100 MG tablet Take 100 mg by mouth daily.   metFORMIN (GLUCOPHAGE-XR) 500 MG 24 hr tablet 1,000 mg daily after supper.   Multiple Vitamins-Minerals (CENTRUM SILVER 50+MEN) TABS Take 1 tablet by mouth daily.   nebivolol (BYSTOLIC) 5 MG tablet Take 0.5 tablets (2.5 mg total) by mouth daily. Hold this medication until you see your PCP and/or cardiologist. HR has been on lower end   NOVOFINE 32G X 6 MM MISC    ONE [facility]  ULTRA TEST test strip    simvastatin (ZOCOR) 20 MG tablet Take 20 mg by mouth at bedtime.   triamterene-hydrochlorothiazide (MAXZIDE-25) 37.5-25 MG tablet Take 1 tablet by mouth daily.   TRULICITY 0.75 MG/0.5ML SOPN Inject 0.75 mg into the skin every Sunday.   No current facility-administered medications for this visit. (Other)    REVIEW OF SYSTEMS: ROS   Positive for: Eyes Negative for: Constitutional, Gastrointestinal, Neurological, Skin, Genitourinary, Musculoskeletal, HENT, Endocrine, Cardiovascular, Respiratory, Psychiatric, Allergic/Imm, Heme/Lymph Last edited by Etheleen Mayhew, COT on 02/05/2023  8:52 AM.      ALLERGIES No Known Allergies  PAST MEDICAL HISTORY Past Medical History:  Diagnosis Date    Cataract 12/17/2021   Diabetes (HCC)    Glaucoma    Hypertension    Stroke [facility])    Past Surgical History:  Procedure Laterality Date   CATARACT EXTRACTION Right    EYE SURGERY Right    Cat Sx   EYE SURGERY Right    Trab   IRIDOTOMY / IRIDECTOMY Right    Trab   TRABECULECTOMY Right    YAG LASER APPLICATION Right    FAMILY HISTORY Family History  Problem Relation Age of Onset   Sleep apnea Sister    Amblyopia Neg Hx    Blindness Neg Hx    Cataracts Neg Hx    Glaucoma Neg Hx    Macular degeneration Neg Hx    Retinal detachment Neg Hx    Strabismus Neg Hx    Retinitis pigmentosa Neg Hx    SOCIAL HISTORY Social History   Tobacco Use   Smoking status: Never   Smokeless tobacco: Never  Vaping Use   Vaping status: Never Used  Substance Use Topics   Alcohol use: Yes    Comment: occassional rum and diet coke   Drug use: Never       OPHTHALMIC EXAM:  Base Eye Exam     Visual Acuity (Snellen - Linear)       Right Left   Dist cc 20/300 20/20   Dist ph cc NI     Correction: Glasses         Tonometry (Tonopen, 8:56 AM)       Right Left   Pressure 14 14         Pupils       Pupils Dark Light Shape React APD   Right PERRL 3 2 Round Brisk None   Left PERRL 3 2 Round Brisk None         Visual Fields       Left Right    Full Full         Extraocular Movement       Right Left    Full, Ortho Full, Ortho         Neuro/Psych     Oriented x3: Yes   Mood/Affect: Normal         Dilation     Both eyes: 2.5% Phenylephrine @ 8:56 AM           Slit Lamp and Fundus Exam     External Exam       Right Left   External Normal Normal         Slit Lamp Exam       Right Left   Lids/Lashes Dermatochalasis - upper lid Dermatochalasis - upper lid, mild Meibomian gland dysfunction   Conjunctiva/Sclera Bleb at 1200 White and quiet   Cornea Trace Punctate epithelial erosions, Well healed cataract  wounds, arcus, tear film debris Arcus,  trace Punctate epithelial erosions, well healed cataract wound, trace Descemet's folds, mild tear film debris   Anterior Chamber Deep and quiet Deep and clear   Iris No NVI, Round and dilated Round and dilated   Lens Posterior chamber intraocular lens; open PC, mild anterior capsular phimosis toric PCIOL w/marks at 0315 and 0915, ant. capsule  phimosis, 1+PCO   Anterior Vitreous Vitreous syneresis - mild Posterior vitreous detachment, Weiss ring, Vitreous syneresis         Fundus Exam       Right Left   Disc Pink and Sharp, Compact Pink and Sharp, compact, temporal Peripapillary atrophy, +disc drusen   C/D Ratio 0.1 0.1   Macula Flat, Blunted foveal reflex, central RPE mottling, clumping and atrophy, mild ERM temporal macula, No heme or edema Flat, good foveal reflex, mild Retinal pigment epithelial mottling and clumping, trace Cystic changes -- stably resolved, rare MA   Vessels attenuated, Tortuous attenuated, Tortuous   Periphery Attached, reticular degeneration, rare MA Attached, mild reticular degeneration, No heme           Refraction     Wearing Rx       Sphere Cylinder Axis Add   Right -0.75 +1.00 003 +2.25   Left -0.50 +0.50 015 +2.25            IMAGING AND PROCEDURES  Imaging and Procedures for 08/22/17  OCT, Retina - OU - Both Eyes       Right Eye Quality was good. Central Foveal Thickness: 325. Progression has been stable. Findings include no IRF, no SRF, abnormal foveal contour, retinal drusen , subretinal hyper-reflective material, epiretinal membrane, macular pucker, outer retinal atrophy.   Left Eye Quality was good. Central Foveal Thickness: 343. Progression has been stable. Findings include normal foveal contour, no IRF, no SRF (Stable improvement in central cystic changes).   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 CME - cystoid macular edema. PED - pigment epithelial detachment. IRF - intraretinal fluid. SRF - subretinal fluid. EZ - ellipsoid zone. ERM - epiretinal membrane. ORA - outer retinal atrophy. ORT - outer retinal tubulation. SRHM - subretinal hyper-reflective material             ASSESSMENT/PLAN:   ICD-10-CM   1. CME (cystoid macular edema), left  H35.352 OCT, Retina - OU - Both Eyes    2. 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 Mild nonproliferative diabetic retinopathy of both eyes without macular edema associated with type 2 diabetes mellitus (HCC)  Z61.0960     4. Diabetes mellitus treated with insulin and oral medication (HCC)  E11.9    Z79.4    Z79.84     5. Epiretinal membrane (ERM) of right eye  H35.371     6. Ocular hypertension, unspecified laterality  H40.059     7. History of trabeculectomy  Z98.890     8. Pseudophakia, both eyes  Z96.1     9. 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 CME OS  - s/p CEIOL OS (~7.13.22, Dr. Joelene Millin)  - completed steroid taper w/ Dr. Joelene Millin  - OCT showed +cystic changes / CME             -  today, cystic changes stably resolved on OCT -- off Lotemax SM and Prolensa  - BCVA 20/20 OS - stable  - f/u in 1 year, DFE OCT  2.  History of CRVO w/ CME OD -- stable  - VA stable at 20/300- today  - history of intravitreal steroids and steroid response - see below  - OCT stable today: central foveal outer retinal atrophy but no IRF/SRF/edema  - monitor  - f/u in 1 yr  3,4. Mild nonproliferative diabetic retinopathy w/o DME, both eyes - The incidence, risk factors for progression, natural history and treatment options for diabetic retinopathy were discussed with patient.   - The need for close monitoring of blood glucose, blood pressure, and serum lipids, avoiding cigarette or any type of  tobacco, and the need for long term follow up was also discussed with patient. - exam shows rare MA OU  - OCT without diabetic macular edema, both eyes  - f/u in 1 yr -- DFE/OCT   5. ERM OD  - mild ERM/pucker in right eye that is limited by central atrophy 2/2 h/o CRVO  - monitor--no indication for surgery at this time  - will discuss if symptoms develop or worsen OD  6,7.  Steroid response / Ocular Hypertension s/p trabeculectomy OD  - IOP OD okay today -- 14 mmHg - using Alphagan OU BID, Xalatan OU qhs, Azopt OU BID as perscribed by Dr. Joelene Millin - followed by Dr. Vena Austria at [facility] at [address] [place], [place] [address], telephone :[phone] fax:[phone]  - monitor  8.  Pseudophakia OU  - s/p CE/IOL OU (Dr. Vena Austria)  - s/p yag capsulotomy OD  - beautiful surgeries  - ?rebound CME OS as above  - monitor  9. Optic disc drusen OS  - discussed diagnosis and significance to vision  - not visually significant at this point  - monitor  Ophthalmic Meds Ordered this visit:  No orders of the defined types were placed in this encounter.    Return in about 1 year (around 02/05/2024) for f/u CRVO , DFE, OCT.  There are no Patient Instructions on file for this visit.  This document serves as a record of services personally performed by Karie Chimera, MD, PhD. It was created on their behalf by De Blanch, an ophthalmic technician. The creation of this record is the provider's dictation and/or activities during the visit.    Electronically signed by: De Blanch, OA, 02/05/23  12:25 PM  This document serves as a record of services personally performed by Karie Chimera, MD, PhD. It was created on their behalf by Gerilyn Nestle, COT an ophthalmic technician. The creation of this record is the provider's dictation and/or activities during the visit.    Electronically signed by:  Charlette Caffey, COT   02/05/23 12:25 PM  Karie Chimera, M.D., Ph.D. Diseases & Surgery of the Retina and Vitreous [facility]  I have reviewed the above documentation for accuracy and completeness, and I agree with the above. Karie Chimera, M.D., Ph.D. 02/05/23 12:25 PM   Abbreviations: M myopia (nearsighted); A astigmatism; H hyperopia (farsighted); P presbyopia; Mrx spectacle prescription;  CTL contact lenses; OD right eye; OS left eye; OU both eyes  XT exotropia; ET esotropia; PEK punctate epithelial keratitis; PEE punctate epithelial erosions; DES dry eye syndrome; MGD meibomian gland dysfunction; ATs artificial tears; PFAT's preservative free artificial tears; NSC nuclear sclerotic cataract; PSC posterior  subcapsular cataract; ERM epi-retinal membrane; PVD posterior vitreous detachment; RD retinal detachment; DM diabetes mellitus; DR diabetic retinopathy; NPDR non-proliferative diabetic retinopathy; PDR proliferative diabetic retinopathy; CSME clinically significant macular edema; DME diabetic macular edema; dbh dot blot hemorrhages; CWS cotton wool spot; POAG primary open angle glaucoma; C/D cup-to-disc ratio; HVF humphrey visual field; GVF goldmann visual field; OCT optical coherence tomography; IOP intraocular pressure; BRVO Branch retinal vein occlusion; CRVO central retinal vein occlusion; CRAO central retinal artery occlusion; BRAO branch retinal artery occlusion; RT retinal tear; SB scleral buckle; PPV pars plana vitrectomy; VH Vitreous hemorrhage; PRP panretinal laser photocoagulation; IVK intravitreal kenalog; VMT vitreomacular traction; MH Macular hole;  NVD neovascularization of the disc; NVE neovascularization elsewhere; AREDS age related eye disease study; ARMD age related macular degeneration; POAG primary open angle glaucoma; EBMD epithelial/anterior basement membrane dystrophy; ACIOL anterior chamber intraocular lens; IOL intraocular lens; PCIOL posterior chamber intraocular  lens; Phaco/IOL phacoemulsification with intraocular lens placement; PRK photorefractive keratectomy; LASIK laser assisted in situ keratomileusis; HTN hypertension; DM diabetes mellitus; COPD chronic obstructive pulmonary disease

## 2023-02-05 ENCOUNTER — Encounter (INDEPENDENT_AMBULATORY_CARE_PROVIDER_SITE_OTHER): Payer: Self-pay | Admitting: Ophthalmology

## 2023-02-05 ENCOUNTER — Ambulatory Visit (INDEPENDENT_AMBULATORY_CARE_PROVIDER_SITE_OTHER): Payer: Medicare Other | Admitting: Ophthalmology

## 2023-02-05 DIAGNOSIS — Z961 Presence of intraocular lens: Secondary | ICD-10-CM

## 2023-02-05 DIAGNOSIS — H35371 Puckering of macula, right eye: Secondary | ICD-10-CM

## 2023-02-05 DIAGNOSIS — H47322 Drusen of optic disc, left eye: Secondary | ICD-10-CM

## 2023-02-05 DIAGNOSIS — H35352 Cystoid macular degeneration, left eye: Secondary | ICD-10-CM

## 2023-02-05 DIAGNOSIS — E113293 Type 2 diabetes mellitus with mild nonproliferative diabetic retinopathy without macular edema, bilateral: Secondary | ICD-10-CM | POA: Diagnosis not present

## 2023-02-05 DIAGNOSIS — Z9889 Other specified postprocedural states: Secondary | ICD-10-CM

## 2023-02-05 DIAGNOSIS — E119 Type 2 diabetes mellitus without complications: Secondary | ICD-10-CM

## 2023-02-05 DIAGNOSIS — Z794 Long term (current) use of insulin: Secondary | ICD-10-CM

## 2023-02-05 DIAGNOSIS — H40059 Ocular hypertension, unspecified eye: Secondary | ICD-10-CM

## 2023-02-05 DIAGNOSIS — H34811 Central retinal vein occlusion, right eye, with macular edema: Secondary | ICD-10-CM

## 2023-02-05 DIAGNOSIS — Z7984 Long term (current) use of oral hypoglycemic drugs: Secondary | ICD-10-CM

## 2023-06-13 ENCOUNTER — Encounter: Payer: Self-pay | Admitting: Cardiovascular Disease

## 2023-06-13 ENCOUNTER — Ambulatory Visit: Payer: Medicare Other | Attending: Cardiovascular Disease | Admitting: Cardiovascular Disease

## 2023-06-13 VITALS — BP 130/56 | HR 53 | Ht 70.0 in | Wt 233.0 lb

## 2023-06-13 DIAGNOSIS — I10 Essential (primary) hypertension: Secondary | ICD-10-CM | POA: Diagnosis not present

## 2023-06-13 DIAGNOSIS — E785 Hyperlipidemia, unspecified: Secondary | ICD-10-CM | POA: Diagnosis present

## 2023-06-13 DIAGNOSIS — I480 Paroxysmal atrial fibrillation: Secondary | ICD-10-CM

## 2023-06-13 NOTE — Patient Instructions (Signed)
 Medication Instructions:  No changes *If you need a refill on your cardiac medications before your next appointment, please call your pharmacy*   Lab Work: None ordered If you have labs (blood work) drawn today and your tests are completely normal, you will receive your results only by: MyChart Message (if you have MyChart) OR A paper copy in the mail If you have any lab test that is abnormal or we need to change your treatment, we will call you to review the results.   Testing/Procedures: None ordered   Follow-Up: At [facility], you and your health needs are our priority.  As part of our continuing mission to provide you with exceptional heart care, we have created designated Provider Care Teams.  These Care Teams include your primary Cardiologist (physician) and Advanced Practice Providers (APPs -  Physician Assistants and Nurse Practitioners) who all work together to provide you with the care you need, when you need it.  We recommend signing up for the patient portal called "MyChart".  Sign up information is provided on this After Visit Summary.  MyChart is used to connect with patients for Virtual Visits (Telemedicine).  Patients are able to view lab/test results, encounter notes, upcoming appointments, etc.  Non-urgent messages can be sent to your provider as well.   To learn more about what you can do with MyChart, go to [email or web address].    Your next appointment:   12 month(s)  Provider:   You may see Lorine Bears, MD or one of the following Advanced Practice Providers on your designated Care Team:   Nicolasa Ducking, NP Eula Listen, PA-C Cadence Fransico Michael, PA-C Charlsie Quest, NP Carlos Levering, NP

## 2023-06-13 NOTE — Progress Notes (Signed)
Cardiology Office Note   Date:  06/13/2023   ID:  Jeffery Beltran, DOB 1941/04/19, MRN [id number]  PCP:  Kandyce Rud, MD  Cardiologist:   Lorine Bears, MD   Chief Complaint  Patient presents with   6 month follow up     "Doing well." Medications reviewed by the patient verbally.       History of Present Illness: Jeffery Beltran is a 82 y.o. male who presents for a follow-up visit regarding paroxysmal atrial fibrillation.   He has past medical history of essential hypertension, hyperlipidemia, type 2 diabetes and obesity.  He was hospitalized in April of 2023 with acute onset of incoordination of his left hand.  Symptoms resolved quickly and he was diagnosed with TIA.  Brain imaging showed cortical diffusion restriction in the right perirolandic sulcus.  The location was suggestive of an embolic etiology.  Echocardiogram showed normal LV systolic function with mild mitral regurgitation and no evidence of PFO.  He was discharged home on aspirin and clopidogrel.  He had outpatient ZIO monitor done which showed paroxysmal atrial fibrillation.  He was switched from dual antiplatelet therapy to Eliquis 5 mg twice daily.  Shortly after that, he had recurrent TIA symptoms and was briefly hospitalized but did not require any intervention.    He was noted to be in atrial fibrillation during an office visit but has been mostly in sinus rhythm. He has resting sinus bradycardia that improved after decreasing the dose of Bystolic.    He has been doing well with no chest pain, shortness of breath or palpitations.  He continues to swim a mile 3 times a week.   Past Medical History:  Diagnosis Date   Cataract 12/17/2021   Diabetes (HCC)    Glaucoma    Hypertension    Stroke [facility])     Past Surgical History:  Procedure Laterality Date   CATARACT EXTRACTION Right    EYE SURGERY Right    Cat Sx   EYE SURGERY Right    Trab   IRIDOTOMY / IRIDECTOMY Right    Trab   TRABECULECTOMY Right     YAG LASER APPLICATION Right      Current Outpatient Medications  Medication Sig Dispense Refill   apixaban (ELIQUIS) 5 MG TABS tablet Take 1 tablet (5 mg total) by mouth 2 (two) times daily. 180 tablet 3   AZOPT 1 % ophthalmic suspension Place 1 drop into both eyes 2 (two) times daily.     brimonidine (ALPHAGAN P) 0.1 % SOLN Place 1 drop into both eyes 2 (two) times daily.     Continuous Blood Gluc Receiver (DEXCOM G6 RECEIVER) DEVI      Dapagliflozin Propanediol (FARXIGA PO) Take 10 mg/day by mouth daily.     ezetimibe (ZETIA) 10 MG tablet Take 1 tablet (10 mg total) by mouth daily. 30 tablet 0   HUMALOG KWIKPEN 100 UNIT/ML KiwkPen Inject 22-24 Units into the skin 3 (three) times daily.     Insulin Glargine 300 UNIT/ML SOPN Inject 16-17 Units into the skin daily after supper.     latanoprost (XALATAN) 0.005 % ophthalmic solution Place 1 drop into both eyes at bedtime.      Loratadine 10 MG CAPS Take 10 mg by mouth daily at 12 noon.     losartan (COZAAR) 100 MG tablet Take 100 mg by mouth daily.     metFORMIN (GLUCOPHAGE-XR) 500 MG 24 hr tablet 1,000 mg daily after supper.     Multiple Vitamins-Minerals (CENTRUM  SILVER 50+MEN) TABS Take 1 tablet by mouth daily.     nebivolol (BYSTOLIC) 5 MG tablet Take 0.5 tablets (2.5 mg total) by mouth daily. Hold this medication until you see your PCP and/or cardiologist. HR has been on lower end     NOVOFINE 32G X 6 MM MISC      ONE TOUCH ULTRA TEST test strip      simvastatin (ZOCOR) 20 MG tablet Take 20 mg by mouth at bedtime.  5   triamterene-hydrochlorothiazide (MAXZIDE-25) 37.5-25 MG tablet Take 1 tablet by mouth daily.     TRULICITY 0.75 MG/0.5ML SOPN Inject 0.75 mg into the skin every Sunday.     Continuous Blood Gluc Sensor (DEXCOM G6 SENSOR) MISC SMARTSIG:1 Each Topical Every 10 Days (Patient not taking: Reported on 06/13/2023)     Continuous Blood Gluc Transmit (DEXCOM G6 TRANSMITTER) MISC  (Patient not taking: Reported on 06/13/2023)      No current facility-administered medications for this visit.    Allergies:   Patient has no known allergies.    Social History:  The patient  reports that he has never smoked. He has never used smokeless tobacco. He reports current alcohol use. He reports that he does not use drugs.   Family History:  The patient's family history is negative for premature coronary artery disease.   ROS:  Please see the history of present illness.   Otherwise, review of systems are positive for none.   All other systems are reviewed and negative.    PHYSICAL EXAM: VS:  BP (!) 130/56 (BP Location: Left Arm, Patient Position: Sitting, Cuff Size: Normal)   Pulse (!) 53   Ht 5\' 10"  (1.778 m)   Wt 233 lb (105.7 kg)   SpO2 97%   BMI 33.43 kg/m  , BMI Body mass index is 33.43 kg/m. GEN: Well nourished, well developed, in no acute distress  HEENT: normal  Neck: no JVD, carotid bruits, or masses Cardiac: Regular rate and rhythm; no murmurs, rubs, or gallops,no edema  Respiratory:  clear to auscultation bilaterally, normal work of breathing GI: soft, nontender, nondistended, + BS MS: no deformity or atrophy  Skin: warm and dry, no rash Neuro:  Strength and sensation are intact Psych: euthymic mood, full affect   EKG:  EKG is ordered today. The ekg ordered today demonstrates: Sinus bradycardia with 1st degree A-V block Nonspecific T wave abnormality    Recent Labs: No results found for requested labs within last 365 days.    Lipid Panel    Component Value Date/Time   CHOL 91 11/07/2021 0456   TRIG 92 11/07/2021 0456   HDL 38 (L) 11/07/2021 0456   CHOLHDL 2.4 11/07/2021 0456   VLDL 18 11/07/2021 0456   LDLCALC 35 11/07/2021 0456      Wt Readings from Last 3 Encounters:  06/13/23 233 lb (105.7 kg)  11/07/22 233 lb 4 oz (105.8 kg)  05/09/22 239 lb (108.4 kg)          11/06/2021    3:21 PM  PAD Screen  Previous PAD dx? No  Previous surgical procedure? No  Pain with walking?  No  Feet/toe relief with dangling? No  Painful, non-healing ulcers? No  Extremities discolored? No      ASSESSMENT AND PLAN:  1.  Paroxysmal atrial fibrillation: He is in sinus rhythm today.   Continue small dose Bystolic.  Continue long-term anticoagulation with Eliquis 5 mg twice daily given CHA2DS2-VASc score of 6.  I reviewed his recent  labs done in December which showed a creatinine of 1.3.  His hemoglobin was normal.    2.  Essential hypertension: Blood pressure is well controlled on current medications.  3.  Hyperlipidemia: He reports abnormal liver enzymes with atorvastatin in the past but he has been on simvastatin and ezetimibe with no issues.  I reviewed most recent lipid profile done in June which showed an LDL of 43 which is at target of less than 70 given that he is diabetic.  4.  Sinus bradycardia when he is not in atrial fibrillation: This seems to be stable on small dose of Bystolic.    Disposition:   FU in 6 months.  Signed,  Lorine Bears, MD  06/13/2023 9:30 AM    [facility]

## 2024-01-06 ENCOUNTER — Other Ambulatory Visit: Payer: Self-pay

## 2024-01-06 ENCOUNTER — Telehealth: Payer: Self-pay | Admitting: Cardiovascular Disease

## 2024-01-06 MED ORDER — APIXABAN 5 MG PO TABS: 5.0000 mg | ORAL_TABLET | Freq: Two times a day (BID) | ORAL | 3 refills | Status: DC

## 2024-01-06 NOTE — Telephone Encounter (Signed)
 Prescription refill request for Eliquis  received. Indication:afib Last office visit:12/24 Scr:1.3  6/25 Age: 83 Weight:105.7  kg  Prescription refilled

## 2024-01-06 NOTE — Telephone Encounter (Signed)
*  STAT* If patient is at the pharmacy, call can be transferred to refill team.   1. Which medications need to be refilled? (please list name of each medication and dose if known) apixaban  (ELIQUIS ) 5 MG TABS tablet   2. Which pharmacy/location (including street and city if local pharmacy) is medication to be sent to?  [facility] - [place], [place] - [facility] AT Portal to Registered [facility] Sites      3. Do they need a 30 day or 90 day supply? 90 day   Pt is due in December but MD calendar hasn't been released yet for pt to schedule.

## 2024-02-02 NOTE — Progress Notes (Signed)
 [facility] Note  02/06/2024     CHIEF COMPLAINT Patient presents for Retina Follow Up    HISTORY OF PRESENT ILLNESS: Jeffery Beltran is a 83 y.o. 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  This started 1 year ago.  I, the attending physician,  performed the HPI with the patient and updated documentation appropriately.        Comments   Patient here for 1 year retina follow up for  CRVO/CME OS. Patient states vision doing fine. No eye pain.       Last edited by Valdemar Rogue, MD on 02/11/2024 10:29 PM.    Patient states he just saw the doc in [place] that monitors his pressures, stable. Still on the same gtts. He reports VA is stable, cannot tell a difference.  Referring physician: A. Melissa Solum [address] [place], [place] [address]  HISTORICAL INFORMATION:  Selected notes from the MEDICAL RECORD NUMBER Referral from Dr. 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   brimonidine (ALPHAGAN P) 0.1 % SOLN Place 1 drop into both eyes 2 (two) times daily.   latanoprost (XALATAN) 0.005 % ophthalmic solution Place 1 drop into both eyes at bedtime.    No current facility-administered medications for this visit. (Ophthalmic Drugs)   Current Outpatient Medications (Other)  Medication Sig   apixaban  (ELIQUIS ) 5 MG TABS tablet Take 1 tablet (5 mg  total) by mouth 2 (two) times daily.   Continuous Blood Gluc Receiver (DEXCOM G6 RECEIVER) DEVI    Dapagliflozin Propanediol (FARXIGA PO) Take 10 mg/day by mouth daily.   ezetimibe  (ZETIA ) 10 MG tablet Take 1 tablet (10 mg total) by mouth daily.   HUMALOG KWIKPEN 100 UNIT/ML KiwkPen Inject 22-24 Units into the skin 3 (three) times daily.   Insulin  Glargine 300 UNIT/ML SOPN Inject 16-17 Units into the skin daily after supper.   Loratadine 10 MG CAPS Take 10 mg by mouth daily at 12 noon.   losartan (COZAAR) 100 MG tablet Take 100 mg by mouth daily.   metFORMIN (GLUCOPHAGE-XR) 500 MG 24 hr tablet 1,000 mg daily after supper.   Multiple Vitamins-Minerals (CENTRUM SILVER 50+MEN) TABS Take 1 tablet by mouth daily.   nebivolol  (BYSTOLIC ) 5 MG tablet Take 0.5 tablets (2.5 mg total) by mouth daily. Hold this medication until you see your PCP and/or cardiologist. HR has been on lower end   NOVOFINE 32G X 6 MM MISC    ONE TOUCH ULTRA TEST test strip    simvastatin  (ZOCOR ) 20 MG tablet Take 20 mg by mouth at bedtime.   triamterene-hydrochlorothiazide (MAXZIDE-25) 37.5-25 MG tablet Take 1 tablet by mouth daily.   TRULICITY 0.75 MG/0.5ML SOPN Inject 0.75 mg into the skin every Sunday.   Continuous Blood Gluc  Sensor (DEXCOM G6 SENSOR) MISC SMARTSIG:1 Each Topical Every 10 Days (Patient not taking: Reported on 02/06/2024)   Continuous Blood Gluc Transmit (DEXCOM G6 TRANSMITTER) MISC  (Patient not taking: Reported on 02/06/2024)   No current facility-administered medications for this visit. (Other)   REVIEW OF SYSTEMS: ROS   Positive for: Eyes Negative for: Constitutional, Gastrointestinal, Neurological, Skin, Genitourinary, Musculoskeletal, HENT, Endocrine, Cardiovascular, Respiratory, Psychiatric, Allergic/Imm, Heme/Lymph Last edited by Orval Asberry RAMAN, COA on 02/06/2024  8:55 AM.     ALLERGIES No Known Allergies  PAST MEDICAL HISTORY Past Medical History:  Diagnosis Date   Cataract 12/17/2021    Diabetes (HCC)    Glaucoma    Hypertension    Stroke [facility])    Past Surgical History:  Procedure Laterality Date   CATARACT EXTRACTION Right    EYE SURGERY Right    Cat Sx   EYE SURGERY Right    Trab   IRIDOTOMY / IRIDECTOMY Right    Trab   TRABECULECTOMY Right    YAG LASER APPLICATION Right    FAMILY HISTORY Family History  Problem Relation Age of Onset   Sleep apnea Sister    Amblyopia Neg Hx    Blindness Neg Hx    Cataracts Neg Hx    Glaucoma Neg Hx    Macular degeneration Neg Hx    Retinal detachment Neg Hx    Strabismus Neg Hx    Retinitis pigmentosa Neg Hx    SOCIAL HISTORY Social History   Tobacco Use   Smoking status: Never   Smokeless tobacco: Never  Vaping Use   Vaping status: Never Used  Substance Use Topics   Alcohol use: Yes    Comment: occassional rum and diet coke   Drug use: Never       OPHTHALMIC EXAM:  Base Eye Exam     Visual Acuity (Snellen - Linear)       Right Left   Dist cc 20/400 -1 20/20   Dist ph cc 20/400 +1     Correction: Glasses         Tonometry (Tonopen, 8:53 AM)       Right Left   Pressure 19 16         Pupils       Dark Light Shape React APD   Right 3 2 Round Brisk None   Left 3 2 Round Brisk None         Visual Fields (Counting fingers)       Left Right    Full Full         Extraocular Movement       Right Left    Full, Ortho Full, Ortho         Neuro/Psych     Oriented x3: Yes   Mood/Affect: Normal         Dilation     Both eyes: 1.0% Mydriacyl, 2.5% Phenylephrine @ 8:53 AM           Slit Lamp and Fundus Exam     External Exam       Right Left   External Normal Normal         Slit Lamp Exam       Right Left   Lids/Lashes Dermatochalasis - upper lid Dermatochalasis - upper lid, mild Meibomian gland dysfunction   Conjunctiva/Sclera Bleb at 1200 White and quiet   Cornea Trace Punctate epithelial erosions, Well healed cataract wounds, arcus, tear film debris  Arcus, 1+Punctate epithelial erosions, well  healed cataract wound, trace Descemet's folds, mild tear film debris   Anterior Chamber Deep and quiet Deep and clear   Iris No NVI, Round and dilated Round and dilated   Lens Posterior chamber intraocular lens; open PC, mild anterior capsular phimosis toric PCIOL w/marks at 0315 and 0915, ant. capsule  phimosis, 1+PCO   Anterior Vitreous Vitreous syneresis - mild Posterior vitreous detachment, Weiss ring, Vitreous syneresis         Fundus Exam       Right Left   Disc Pink and Sharp, Compact, mild temporal pallor Pink and Sharp, compact, temporal Peripapillary atrophy, +disc drusen   C/D Ratio 0.1 0.1   Macula Flat, Blunted foveal reflex, central RPE mottling, clumping and atrophy, mild ERM temporal macula, No heme or edema Flat, good foveal reflex, mild Retinal pigment epithelial mottling and clumping, trace Cystic changes, rare MA   Vessels attenuated, Tortuous attenuated, Tortuous   Periphery Attached, reticular degeneration, rare MA, focal DBH nasal to disc. Attached, mild reticular degeneration, No heme           Refraction     Wearing Rx       Sphere Cylinder Axis Add   Right -0.75 +1.00 003 +2.25   Left -0.50 +0.50 015 +2.25            IMAGING AND PROCEDURES  Imaging and Procedures for 08/22/17  OCT, Retina - OU - Both Eyes       Right Eye Quality was good. Central Foveal Thickness: 318. Progression has been stable. Findings include no IRF, no SRF, abnormal foveal contour, retinal drusen , subretinal hyper-reflective material, epiretinal membrane, macular pucker, outer retinal atrophy.   Left Eye Quality was good. Central Foveal Thickness: 342. Progression has been stable. Findings include normal foveal contour, no IRF, no SRF (Trace cystic changes nasal macula).   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 CME - cystoid macular edema. PED - pigment epithelial detachment. IRF - intraretinal fluid. SRF - subretinal fluid. EZ - ellipsoid zone. ERM - epiretinal membrane. ORA - outer retinal atrophy. ORT - outer retinal tubulation. SRHM - subretinal hyper-reflective material             ASSESSMENT/PLAN:   ICD-10-CM   1. CME (cystoid macular edema), left  H35.352 OCT, Retina - OU - Both Eyes    2. 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 Mild nonproliferative diabetic retinopathy of both eyes without macular edema associated with type 2 diabetes mellitus (HCC)  Z88.6706     4. Diabetes mellitus treated with insulin  and oral medication (HCC)  E11.9    Z79.4    Z79.84     5. Epiretinal membrane (ERM) of right eye  H35.371     6. Ocular hypertension, unspecified laterality  H40.059     7. History of trabeculectomy  Z98.890     8. Pseudophakia, both eyes  Z96.1     9. 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 H/o CME OS  - s/p CEIOL OS (~7.13.22, Dr. Chesley)  - completed steroid taper w/ Dr. Chesley  - OCT showed trace cystic changes nasal macula             -  off Lotemax  SM and Prolensa   - BCVA 20/20 OS - stable  - f/u in 1 year, DFE OCT  2.  History of CRVO w/ CME OD -- stable  - VA stable at 20/300- today  - history of intravitreal steroids and steroid response - see below  - OCT stable today: central foveal outer retinal atrophy but no IRF/SRF/edema  - monitor  - f/u in 1 yr  3,4. Mild nonproliferative diabetic retinopathy w/o DME, both eyes - The incidence, risk factors for progression, natural history and treatment options for diabetic retinopathy were discussed with patient.   - The need for close monitoring of blood glucose, blood pressure, and serum lipids, avoiding cigarette or any type of tobacco, and the need  for long term follow up was also discussed with patient. - exam shows rare MA OU  - OCT without diabetic macular edema, both eyes  - f/u in 1 yr -- DFE/OCT  5. ERM OD  - mild ERM/pucker in right eye that is limited by central atrophy 2/2 h/o CRVO  - monitor--no indication for surgery at this time  - will discuss if symptoms develop or worsen OD  6,7.  Steroid response / Ocular Hypertension s/p trabeculectomy OD  - IOP OD okay today -- 14 mmHg - using Alphagan OU BID, Xalatan OU qhs, Azopt OU BID as perscribed by Dr. Chesley - followed by Dr. Harlene Chesley at [facility] at [address], [address], [place] [address], telephone :[phone] [phone]  - monitor  8.  Pseudophakia OU  - s/p CE/IOL OU (Dr. Harlene Chesley)  - s/p yag capsulotomy OD  - beautiful surgeries  - ?rebound CME OS as above  - monitor  9. Optic disc drusen OS  - discussed diagnosis and significance to vision  - not visually significant at this point  - monitor  Ophthalmic Meds Ordered this visit:  No orders of the defined types were placed in this encounter.    Return in about 1 year (around 02/05/2025) for CME OS, DFE, OCT.  There are no Patient Instructions on file for this visit.  This document serves as a record of services personally performed by Redell JUDITHANN Hans, MD, PhD. It was created on their behalf by Avelina Pereyra, COA an ophthalmic technician. The creation of this record is the provider's dictation and/or activities during the visit.   Electronically signed by: Avelina GORMAN Pereyra, COT  02/11/24  10:30 PM    Redell JUDITHANN Hans, M.D., Ph.D. Diseases & Surgery of the Retina and Vitreous [facility]  I have reviewed the above documentation for accuracy and completeness, and I agree with the above. Redell JUDITHANN Hans, M.D., Ph.D. 02/11/24 10:33 PM    Abbreviations: M myopia (nearsighted); A astigmatism; H hyperopia (farsighted); P  presbyopia; Mrx spectacle prescription;  CTL contact lenses; OD right eye; OS left eye; OU both eyes  XT exotropia; ET esotropia; PEK punctate epithelial keratitis; PEE punctate epithelial erosions; DES dry eye syndrome; MGD meibomian gland dysfunction; ATs artificial tears; PFAT's preservative free artificial tears; NSC nuclear sclerotic cataract; PSC posterior subcapsular cataract; ERM epi-retinal membrane; PVD posterior vitreous detachment; RD retinal detachment; DM diabetes mellitus; DR diabetic retinopathy; NPDR non-proliferative diabetic retinopathy; PDR proliferative diabetic retinopathy; CSME clinically significant macular edema; DME diabetic macular edema; dbh dot blot hemorrhages; CWS cotton wool spot; POAG primary open angle glaucoma; C/D cup-to-disc ratio; HVF humphrey visual field; GVF goldmann visual field; OCT optical coherence tomography; IOP  intraocular pressure; BRVO Branch retinal vein occlusion; CRVO central retinal vein occlusion; CRAO central retinal artery occlusion; BRAO branch retinal artery occlusion; RT retinal tear; SB scleral buckle; PPV pars plana vitrectomy; VH Vitreous hemorrhage; PRP panretinal laser photocoagulation; IVK intravitreal kenalog; VMT vitreomacular traction; MH Macular hole;  NVD neovascularization of the disc; NVE neovascularization elsewhere; AREDS age related eye disease study; ARMD age related macular degeneration; POAG primary open angle glaucoma; EBMD epithelial/anterior basement membrane dystrophy; ACIOL anterior chamber intraocular lens; IOL intraocular lens; PCIOL posterior chamber intraocular lens; Phaco/IOL phacoemulsification with intraocular lens placement; PRK photorefractive keratectomy; LASIK laser assisted in situ keratomileusis; HTN hypertension; DM diabetes mellitus; COPD chronic obstructive pulmonary disease

## 2024-02-06 ENCOUNTER — Encounter (INDEPENDENT_AMBULATORY_CARE_PROVIDER_SITE_OTHER): Payer: Self-pay | Admitting: Ophthalmology

## 2024-02-06 ENCOUNTER — Ambulatory Visit (INDEPENDENT_AMBULATORY_CARE_PROVIDER_SITE_OTHER): Payer: Medicare Other | Admitting: Ophthalmology

## 2024-02-06 DIAGNOSIS — H40059 Ocular hypertension, unspecified eye: Secondary | ICD-10-CM

## 2024-02-06 DIAGNOSIS — Z7984 Long term (current) use of oral hypoglycemic drugs: Secondary | ICD-10-CM

## 2024-02-06 DIAGNOSIS — H35371 Puckering of macula, right eye: Secondary | ICD-10-CM

## 2024-02-06 DIAGNOSIS — H35352 Cystoid macular degeneration, left eye: Secondary | ICD-10-CM

## 2024-02-06 DIAGNOSIS — E113293 Type 2 diabetes mellitus with mild nonproliferative diabetic retinopathy without macular edema, bilateral: Secondary | ICD-10-CM

## 2024-02-06 DIAGNOSIS — Z794 Long term (current) use of insulin: Secondary | ICD-10-CM

## 2024-02-06 DIAGNOSIS — Z961 Presence of intraocular lens: Secondary | ICD-10-CM

## 2024-02-06 DIAGNOSIS — Z9889 Other specified postprocedural states: Secondary | ICD-10-CM

## 2024-02-06 DIAGNOSIS — H34811 Central retinal vein occlusion, right eye, with macular edema: Secondary | ICD-10-CM | POA: Diagnosis not present

## 2024-02-06 DIAGNOSIS — H47322 Drusen of optic disc, left eye: Secondary | ICD-10-CM

## 2024-02-11 ENCOUNTER — Encounter (INDEPENDENT_AMBULATORY_CARE_PROVIDER_SITE_OTHER): Payer: Self-pay | Admitting: Ophthalmology

## 2024-06-18 ENCOUNTER — Ambulatory Visit: Attending: Cardiovascular Disease | Admitting: Cardiovascular Disease

## 2024-06-18 ENCOUNTER — Encounter: Payer: Self-pay | Admitting: Cardiovascular Disease

## 2024-06-18 VITALS — BP 128/60 | HR 79 | Ht 70.0 in | Wt 237.0 lb

## 2024-06-18 DIAGNOSIS — I10 Essential (primary) hypertension: Secondary | ICD-10-CM | POA: Diagnosis present

## 2024-06-18 DIAGNOSIS — E7800 Pure hypercholesterolemia, unspecified: Secondary | ICD-10-CM

## 2024-06-18 DIAGNOSIS — I480 Paroxysmal atrial fibrillation: Secondary | ICD-10-CM | POA: Diagnosis present

## 2024-06-18 MED ORDER — NEBIVOLOL HCL 2.5 MG PO TABS: 2.5000 mg | ORAL_TABLET | Freq: Every day | ORAL | 3 refills | Status: AC

## 2024-06-18 MED ORDER — APIXABAN 5 MG PO TABS: 5.0000 mg | ORAL_TABLET | Freq: Two times a day (BID) | ORAL | 3 refills | Status: AC

## 2024-06-18 NOTE — Patient Instructions (Signed)

## 2024-06-18 NOTE — Progress Notes (Signed)
 Cardiology Office Note   Date:  06/18/2024   ID:  Jeffery Beltran, DOB [birth year], MRN [id number]  PCP:  Diedra Lame, MD  Cardiologist:   Deatrice Cage, MD   Chief Complaint  Patient presents with   Follow-up    12 month f/u no complaints today. Meds reviewed verbally with pt.      History of Present Illness: Jeffery Beltran is a 83 y.o. male who presents for a follow-up visit regarding paroxysmal atrial fibrillation.   He has past medical history of essential hypertension, hyperlipidemia, type 2 diabetes and obesity.  He was hospitalized in April of 2023 TIA .  Brain imaging showed cortical diffusion restriction in the right perirolandic sulcus.  The location was suggestive of an embolic etiology.  Echocardiogram showed normal LV systolic function with mild mitral regurgitation and no evidence of PFO.  He was discharged home on aspirin  and clopidogrel .  He had outpatient ZIO monitor done which showed paroxysmal atrial fibrillation.  He was switched from dual antiplatelet therapy to Eliquis  5 mg twice daily.  Shortly after that, he had recurrent TIA symptoms and was briefly hospitalized but did not require any intervention.    He has history of bradycardia when he is in sinus rhythm but has been stable on 2.5 mg once daily of Bystolic .  He was in sinus rhythm last year but he is in atrial fibrillation today.  He cannot tell the difference.  He exercises daily and tries to walk for an hour.  He did notice some decreased exercise tolerance.  No chest pain.  Past Medical History:  Diagnosis Date   Cataract 12/17/2021   Diabetes (HCC)    Glaucoma    Hypertension    Stroke [facility])     Past Surgical History:  Procedure Laterality Date   CATARACT EXTRACTION Right    EYE SURGERY Right    Cat Sx   EYE SURGERY Right    Trab   IRIDOTOMY / IRIDECTOMY Right    Trab   TRABECULECTOMY Right    YAG LASER APPLICATION Right      Current Outpatient Medications  Medication Sig  Dispense Refill   apixaban  (ELIQUIS ) 5 MG TABS tablet Take 1 tablet (5 mg total) by mouth 2 (two) times daily. 180 tablet 3   AZOPT 1 % ophthalmic suspension Place 1 drop into both eyes 2 (two) times daily.     brimonidine (ALPHAGAN P) 0.1 % SOLN Place 1 drop into both eyes 2 (two) times daily.     Continuous Glucose Receiver (DEXCOM G7 RECEIVER) DEVI by Does not apply route daily.     Continuous Glucose Sensor (DEXCOM G7 SENSOR) MISC by Does not apply route daily.     Dapagliflozin Propanediol (FARXIGA PO) Take 10 mg/day by mouth daily.     ezetimibe  (ZETIA ) 10 MG tablet Take 1 tablet (10 mg total) by mouth daily. 30 tablet 0   HUMALOG KWIKPEN 100 UNIT/ML KiwkPen Inject 22-24 Units into the skin 3 (three) times daily.     Insulin  Glargine 300 UNIT/ML SOPN Inject 16-17 Units into the skin daily after supper.     latanoprost (XALATAN) 0.005 % ophthalmic solution Place 1 drop into both eyes at bedtime.      Loratadine 10 MG CAPS Take 10 mg by mouth daily at 12 noon.     losartan (COZAAR) 100 MG tablet Take 100 mg by mouth daily.     metFORMIN (GLUCOPHAGE-XR) 500 MG 24 hr tablet 1,000 mg daily after supper.  Multiple Vitamins-Minerals (CENTRUM SILVER 50+MEN) TABS Take 1 tablet by mouth daily.     nebivolol  (BYSTOLIC ) 5 MG tablet Take 0.5 tablets (2.5 mg total) by mouth daily. Hold this medication until you see your PCP and/or cardiologist. HR has been on lower end     NOVOFINE 32G X 6 MM MISC      ONE TOUCH ULTRA TEST test strip      simvastatin  (ZOCOR ) 20 MG tablet Take 20 mg by mouth at bedtime.  5   triamterene-hydrochlorothiazide (MAXZIDE-25) 37.5-25 MG tablet Take 1 tablet by mouth daily.     TRULICITY 0.75 MG/0.5ML SOPN Inject 0.75 mg into the skin every Sunday.     Continuous Blood Gluc Receiver (DEXCOM G6 RECEIVER) DEVI  (Patient not taking: Reported on 06/18/2024)     Continuous Blood Gluc Sensor (DEXCOM G6 SENSOR) MISC SMARTSIG:1 Each Topical Every 10 Days (Patient not taking:  Reported on 06/18/2024)     Continuous Blood Gluc Transmit (DEXCOM G6 TRANSMITTER) MISC  (Patient not taking: Reported on 06/18/2024)     No current facility-administered medications for this visit.    Allergies:   Patient has no known allergies.    Social History:  The patient  reports that he has never smoked. He has never used smokeless tobacco. He reports current alcohol use. He reports that he does not use drugs.   Family History:  The patient's family history is negative for premature coronary artery disease.   ROS:  Please see the history of present illness.   Otherwise, review of systems are positive for none.   All other systems are reviewed and negative.    PHYSICAL EXAM: VS:  BP 128/60 (BP Location: Left Arm, Patient Position: Sitting, Cuff Size: Large)   Pulse 79   Ht 5' 10 (1.778 m)   Wt 237 lb (107.5 kg)   SpO2 97%   BMI 34.01 kg/m  , BMI Body mass index is 34.01 kg/m. GEN: Well nourished, well developed, in no acute distress  HEENT: normal  Neck: no JVD, carotid bruits, or masses Cardiac: Regular rate and rhythm; no murmurs, rubs, or gallops,no edema  Respiratory:  clear to auscultation bilaterally, normal work of breathing GI: soft, nontender, nondistended, + BS MS: no deformity or atrophy  Skin: warm and dry, no rash Neuro:  Strength and sensation are intact Psych: euthymic mood, full affect   EKG:  EKG is ordered today. The ekg ordered today demonstrates: Atrial flutter with variable A-V block Nonspecific T wave abnormality When compared with ECG of 13-Jun-2023 09:14, Atrial flutter has replaced Sinus rhythm Vent. rate has increased BY  26 BPM Nonspecific T wave abnormality no longer evident in Inferior leads     Recent Labs: No results found for requested labs within last 365 days.    Lipid Panel    Component Value Date/Time   CHOL 91 11/07/2021 0456   TRIG 92 11/07/2021 0456   HDL 38 (L) 11/07/2021 0456   CHOLHDL 2.4 11/07/2021 0456    VLDL 18 11/07/2021 0456   LDLCALC 35 11/07/2021 0456      Wt Readings from Last 3 Encounters:  06/18/24 237 lb (107.5 kg)  06/13/23 233 lb (105.7 kg)  11/07/22 233 lb 4 oz (105.8 kg)          11/06/2021    3:21 PM  PAD Screen  Previous PAD dx? No  Previous surgical procedure? No  Pain with walking? No  Feet/toe relief with dangling? No  Painful, non-healing ulcers?  No  Extremities discolored? No      ASSESSMENT AND PLAN:  1.  Paroxysmal atrial fibrillation: He is in atrial fibrillation today with controlled ventricular rate.  Continue small dose of Bystolic  which was refilled.  Continue long-term anticoagulation with Eliquis  5 mg twice daily which was also refilled.   CHA2DS2-VASc score is 6.  I reviewed most recent labs done in June which showed normal CBC.  His creatinine was 1.3.  If creatinine goes above 1.5, we will plan on decreasing the dose of Eliquis .  2.  Essential hypertension: Blood pressure is well controlled on current medications.  3.  Hyperlipidemia: He reports abnormal liver enzymes with atorvastatin  in the past but he has been on simvastatin  and ezetimibe  with no issues.  Most recent lipid profile in June showed an LDL of 51.  He is on a statin given that he is diabetic.  4.  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    Disposition:   FU in 12 months.  Signed,  Deatrice Cage, MD  06/18/2024 9:51 AM    [facility]

## 2025-02-04 ENCOUNTER — Encounter (INDEPENDENT_AMBULATORY_CARE_PROVIDER_SITE_OTHER): Admitting: Ophthalmology
